# Patient Record
Sex: Male | Born: 1960 | Race: White | Hispanic: No | Smoking: Current every day smoker
Health system: Southern US, Community
[De-identification: ages and names within clinical notes are randomized; demographics above are authoritative.]

## PROBLEM LIST (undated history)

## (undated) DIAGNOSIS — K219 Gastro-esophageal reflux disease without esophagitis: Secondary | ICD-10-CM

## (undated) DIAGNOSIS — E785 Hyperlipidemia, unspecified: Secondary | ICD-10-CM

## (undated) DIAGNOSIS — J449 Chronic obstructive pulmonary disease, unspecified: Secondary | ICD-10-CM

## (undated) DIAGNOSIS — I2510 Atherosclerotic heart disease of native coronary artery without angina pectoris: Secondary | ICD-10-CM

## (undated) DIAGNOSIS — E039 Hypothyroidism, unspecified: Secondary | ICD-10-CM

## (undated) DIAGNOSIS — I639 Cerebral infarction, unspecified: Secondary | ICD-10-CM

## (undated) DIAGNOSIS — M3130 Wegener's granulomatosis without renal involvement: Secondary | ICD-10-CM

## (undated) HISTORY — DX: Hyperlipidemia, unspecified: E78.5

## (undated) HISTORY — DX: Cerebral infarction, unspecified: I63.9

## (undated) HISTORY — PX: OTHER SURGICAL HISTORY: SHX169

## (undated) HISTORY — PX: CORONARY ARTERY BYPASS GRAFT: SHX141

## (undated) HISTORY — DX: Atherosclerotic heart disease of native coronary artery without angina pectoris: I25.10

## (undated) HISTORY — DX: Hypothyroidism, unspecified: E03.9

## (undated) HISTORY — DX: Chronic obstructive pulmonary disease, unspecified: J44.9

## (undated) HISTORY — DX: Wegener's granulomatosis without renal involvement: M31.30

## (undated) HISTORY — DX: Gastro-esophageal reflux disease without esophagitis: K21.9

---

## 2005-04-10 ENCOUNTER — Ambulatory Visit: Payer: Self-pay | Admitting: Endocrinology

## 2005-05-04 ENCOUNTER — Ambulatory Visit: Payer: Self-pay | Admitting: Endocrinology

## 2005-06-08 ENCOUNTER — Ambulatory Visit: Payer: Self-pay | Admitting: Endocrinology

## 2005-07-24 ENCOUNTER — Ambulatory Visit: Payer: Self-pay | Admitting: Endocrinology

## 2006-01-25 ENCOUNTER — Ambulatory Visit: Payer: Self-pay | Admitting: Endocrinology

## 2006-03-21 ENCOUNTER — Ambulatory Visit: Payer: Self-pay | Admitting: Endocrinology

## 2006-05-02 ENCOUNTER — Ambulatory Visit: Payer: Self-pay | Admitting: Endocrinology

## 2006-05-02 LAB — CONVERTED CEMR LAB
TSH: 2.96 microintl units/mL (ref 0.35–5.50)
Testosterone: 689.93 ng/dL (ref 350.00–890)

## 2006-08-01 ENCOUNTER — Ambulatory Visit: Payer: Self-pay | Admitting: Endocrinology

## 2006-08-07 ENCOUNTER — Encounter: Payer: Self-pay | Admitting: Endocrinology

## 2006-08-07 DIAGNOSIS — E039 Hypothyroidism, unspecified: Secondary | ICD-10-CM

## 2006-08-07 DIAGNOSIS — K219 Gastro-esophageal reflux disease without esophagitis: Secondary | ICD-10-CM

## 2006-08-07 DIAGNOSIS — I2510 Atherosclerotic heart disease of native coronary artery without angina pectoris: Secondary | ICD-10-CM | POA: Insufficient documentation

## 2006-08-07 HISTORY — DX: Gastro-esophageal reflux disease without esophagitis: K21.9

## 2006-08-07 HISTORY — DX: Hypothyroidism, unspecified: E03.9

## 2006-08-07 HISTORY — DX: Atherosclerotic heart disease of native coronary artery without angina pectoris: I25.10

## 2006-10-31 ENCOUNTER — Ambulatory Visit: Payer: Self-pay | Admitting: Endocrinology

## 2006-10-31 LAB — CONVERTED CEMR LAB: Hgb A1c MFr Bld: 9.5 % — ABNORMAL HIGH (ref 4.6–6.0)

## 2006-11-12 ENCOUNTER — Encounter: Payer: Self-pay | Admitting: Endocrinology

## 2007-01-13 ENCOUNTER — Telehealth (INDEPENDENT_AMBULATORY_CARE_PROVIDER_SITE_OTHER): Payer: Self-pay | Admitting: *Deleted

## 2007-01-31 ENCOUNTER — Encounter: Payer: Self-pay | Admitting: Internal Medicine

## 2007-02-11 ENCOUNTER — Encounter: Payer: Self-pay | Admitting: Endocrinology

## 2007-02-26 ENCOUNTER — Ambulatory Visit: Payer: Self-pay | Admitting: Endocrinology

## 2007-02-26 DIAGNOSIS — I889 Nonspecific lymphadenitis, unspecified: Secondary | ICD-10-CM

## 2007-02-26 LAB — CONVERTED CEMR LAB: Hgb A1c MFr Bld: 9.2 % — ABNORMAL HIGH (ref 4.6–6.0)

## 2007-05-27 ENCOUNTER — Ambulatory Visit: Payer: Self-pay | Admitting: Endocrinology

## 2007-05-27 DIAGNOSIS — J309 Allergic rhinitis, unspecified: Secondary | ICD-10-CM | POA: Insufficient documentation

## 2007-05-27 DIAGNOSIS — F411 Generalized anxiety disorder: Secondary | ICD-10-CM

## 2007-06-05 ENCOUNTER — Encounter: Payer: Self-pay | Admitting: Endocrinology

## 2007-06-10 ENCOUNTER — Telehealth (INDEPENDENT_AMBULATORY_CARE_PROVIDER_SITE_OTHER): Payer: Self-pay | Admitting: *Deleted

## 2007-06-11 ENCOUNTER — Telehealth: Payer: Self-pay | Admitting: Endocrinology

## 2007-06-26 ENCOUNTER — Telehealth: Payer: Self-pay | Admitting: Endocrinology

## 2007-06-26 ENCOUNTER — Ambulatory Visit: Payer: Self-pay | Admitting: Endocrinology

## 2007-06-29 ENCOUNTER — Encounter: Payer: Self-pay | Admitting: Endocrinology

## 2007-06-30 ENCOUNTER — Encounter: Payer: Self-pay | Admitting: Endocrinology

## 2007-07-07 ENCOUNTER — Telehealth (INDEPENDENT_AMBULATORY_CARE_PROVIDER_SITE_OTHER): Payer: Self-pay | Admitting: *Deleted

## 2007-07-10 ENCOUNTER — Ambulatory Visit: Payer: Self-pay | Admitting: Endocrinology

## 2007-07-14 ENCOUNTER — Encounter: Payer: Self-pay | Admitting: Endocrinology

## 2007-07-22 ENCOUNTER — Encounter: Payer: Self-pay | Admitting: Endocrinology

## 2007-08-13 ENCOUNTER — Ambulatory Visit: Payer: Self-pay | Admitting: Endocrinology

## 2007-09-01 ENCOUNTER — Ambulatory Visit: Payer: Self-pay | Admitting: Endocrinology

## 2007-09-01 DIAGNOSIS — N529 Male erectile dysfunction, unspecified: Secondary | ICD-10-CM

## 2007-09-01 DIAGNOSIS — L538 Other specified erythematous conditions: Secondary | ICD-10-CM

## 2007-09-04 ENCOUNTER — Encounter: Payer: Self-pay | Admitting: Endocrinology

## 2007-09-26 ENCOUNTER — Ambulatory Visit: Payer: Self-pay | Admitting: Endocrinology

## 2007-11-27 ENCOUNTER — Encounter: Payer: Self-pay | Admitting: Endocrinology

## 2007-12-22 ENCOUNTER — Telehealth (INDEPENDENT_AMBULATORY_CARE_PROVIDER_SITE_OTHER): Payer: Self-pay | Admitting: *Deleted

## 2007-12-25 ENCOUNTER — Ambulatory Visit: Payer: Self-pay | Admitting: Endocrinology

## 2007-12-25 LAB — CONVERTED CEMR LAB: Hgb A1c MFr Bld: 9.4 % — ABNORMAL HIGH (ref 4.6–6.0)

## 2008-04-13 ENCOUNTER — Telehealth: Payer: Self-pay | Admitting: Endocrinology

## 2008-04-20 ENCOUNTER — Ambulatory Visit: Payer: Self-pay | Admitting: Endocrinology

## 2008-04-20 LAB — CONVERTED CEMR LAB: Hgb A1c MFr Bld: 8.9 % — ABNORMAL HIGH (ref 4.6–6.5)

## 2008-06-03 ENCOUNTER — Encounter: Payer: Self-pay | Admitting: Endocrinology

## 2008-07-06 ENCOUNTER — Encounter: Payer: Self-pay | Admitting: Endocrinology

## 2008-07-21 ENCOUNTER — Encounter: Payer: Self-pay | Admitting: Endocrinology

## 2008-07-22 ENCOUNTER — Ambulatory Visit: Payer: Self-pay | Admitting: Endocrinology

## 2008-08-20 ENCOUNTER — Telehealth: Payer: Self-pay | Admitting: Endocrinology

## 2010-01-24 ENCOUNTER — Telehealth: Payer: Self-pay | Admitting: Endocrinology

## 2010-01-24 ENCOUNTER — Ambulatory Visit
Admission: RE | Admit: 2010-01-24 | Discharge: 2010-01-24 | Payer: Self-pay | Source: Home / Self Care | Attending: Endocrinology | Admitting: Endocrinology

## 2010-01-24 DIAGNOSIS — L97909 Non-pressure chronic ulcer of unspecified part of unspecified lower leg with unspecified severity: Secondary | ICD-10-CM | POA: Insufficient documentation

## 2010-02-07 ENCOUNTER — Ambulatory Visit: Admit: 2010-02-07 | Payer: Self-pay | Admitting: Endocrinology

## 2010-02-07 ENCOUNTER — Ambulatory Visit
Admission: RE | Admit: 2010-02-07 | Discharge: 2010-02-07 | Payer: Self-pay | Source: Home / Self Care | Attending: Endocrinology | Admitting: Endocrinology

## 2010-02-09 NOTE — Progress Notes (Signed)
Summary: rx ? from Pharm  Phone Note From Pharmacy   Caller: [facility] Summary of Call: [facility] sent fax regarding rx's sent in for pt's insulin this morning. Per pharmacy, the Novolog flexpen needs specific directions because they cannot use as directed for insurance purposes.  They also want to know if you want a separate rx for pen needles and insulin syringes (for Levemir)-please advise Initial call taken by: Brenton Grills CMA Duncan Dull),  January 24, 2010 1:48 PM  Follow-up for Phone Call        sent Follow-up by: Minus Breeding MD,  January 24, 2010 1:59 PM    New/Updated Medications: NOVOLOG FLEXPEN 100 UNIT/ML SOLN (INSULIN ASPART) take three times a day (just before each meal), 2 units for high-200's,and  5 units for any over 300 INSULIN SYRINGE 31G X 5/16" 0.5 ML MISC (INSULIN SYRINGE-NEEDLE U-100) use once daily BD PEN NEEDLE MINI U/F 31G X 5 MM MISC (INSULIN PEN NEEDLE) 50-unit, once daily Prescriptions: NOVOLOG FLEXPEN 100 UNIT/ML SOLN (INSULIN ASPART) take three times a day (just before each meal), 2 units for high-200's,and  5 units for any over 300  #1 box x 11   Entered and Authorized by:   Minus Breeding MD   Signed by:   Minus Breeding MD on 01/24/2010   Method used:   Electronically to        [facility] (retail)       [address]       [place], [place]  [address]       Ph: [phone] or [phone]       Fax: [phone]   RxID:   [id number] BD PEN NEEDLE MINI U/F 31G X 5 MM MISC (INSULIN PEN NEEDLE) 50-unit, once daily  #30 x 11   Entered and Authorized by:   Minus Breeding MD   Signed by:   Minus Breeding MD on 01/24/2010   Method used:   Electronically to        [facility] (retail)       [address]       [place], [place]  [address]       Ph: [phone] or [phone]       Fax: [phone]   RxID:   [id number] INSULIN SYRINGE 31G X 5/16" 0.5 ML  MISC (INSULIN SYRINGE-NEEDLE U-100) use once daily  #30 x 11   Entered and Authorized by:   Minus Breeding MD   Signed by:   Minus Breeding MD on 01/24/2010   Method used:   Electronically to        [facility] (retail)       [address]       [place], [place]  [address]       Ph: [phone] or [phone]       Fax: [phone]   RxID:   [id number]

## 2010-02-09 NOTE — Assessment & Plan Note (Signed)
Summary: PER Kenneth Sheppard--SORE ON LEG/DRAINING--ER/URG CAR IF WORSEN STC   Vital Signs:  Patient profile:   50 year old male Height:      71 inches (180.34 cm) Weight:      149 pounds (67.73 kg) BMI:     20.86 O2 Sat:      97 % on Room air Temp:     97.9 degrees F (36.61 degrees C) oral Pulse rate:   88 / minute Pulse rhythm:   regular BP sitting:   98 / 50  (left arm) Cuff size:   regular  Vitals Entered By: Brenton Grills CMA Duncan Dull) (January 24, 2010 8:03 AM)  O2 Flow:  Room air CC: Wound on L leg/pt is no longer taking Multivitamins, Prilosec, Ambien, Levoxyl, or Levitra/aj Is Patient Diabetic? Yes   Primary Provider:  b hodges  CC:  Wound on L leg/pt is no longer taking Multivitamins, Prilosec, Ambien, Levoxyl, and or Levitra/aj.  History of Present Illness: 3 weeks ago, pt was walking outside, and "ran into something."  he had moderate pain at the left leg since then.  he was seen in er at [facility], and was rx'ed keflex and septra.  he has assoc numbness of the legs.   no cbg record, but states cbg's continue to be extremely variable.    Current Medications (verified): 1)  Multivitamins   Tabs (Multiple Vitamin) .... Take 1 By Mouth Qd 2)  Prilosec 20 Mg  Cpdr (Omeprazole) .... Take 1 By Mouth Bid 3)  Zocor 80 Mg  Tabs (Simvastatin) .... Take 1 By Mouth Qd 4)  Ambien 10 Mg  Tabs (Zolpidem Tartrate) .... 1/2 Qhs 5)  Glucometer Elite Test   Strp (Glucose Blood) .... Qid--Lancets Also 250.03 Variable Glucoses 6)  Levoxyl 150 Mcg Tabs (Levothyroxine Sodium) .... Take 1 By Mouth Qd 7)  Insulin Pen Needle, and Brand and Size .... Qid 8)  Novolog Mix 70/30 70-30 % Susp (Insulin Aspart Prot & Aspart) .... 28 Units Qam  20 Qpm 9)  Levitra 20 Mg Tabs (Vardenafil Hcl) .... As Needed Use 10)  Lisinopril-Hydrochlorothiazide 10-12.5 Mg Tabs (Lisinopril-Hydrochlorothiazide) .Marland Kitchen.. 1 Tab Daily 11)  Neurontin 400 Mg Caps (Gabapentin) .Marland Kitchen.. 1 Tab Three Times A Day 12)  Plavix 75 Mg  Tabs (Clopidogrel Bisulfate) .Marland Kitchen.. 1 Tablet By Mouth Once Daily 13)  Aspirin 81 Mg Tbec (Aspirin) .Marland Kitchen.. 1 By Mouth Once Daily 14)  Sulfamethoxazole-Trimethoprim 200-40 Mg/1ml Susp (Sulfamethoxazole-Trimethoprim) .Marland Kitchen.. 1 Tablet By Mouth Two Times A Day 15)  Cephalexin 500 Mg Caps (Cephalexin) .Marland Kitchen.. 1 Capsule By Mouth Three Times A Day  Allergies (verified): 1)  ! Codeine  Past History:  Past Medical History: Last updated: 02/26/2007 Coronary artery disease Diabetes mellitus, type II GERD Hypothyroidism Dyslipidemia Smoker DM Neparopathy necrotizing granuloma  Review of Systems  The patient denies fever and syncope.         he has frequent mild hypoglycemia  Physical Exam  General:  normal appearance.   Extremities:  at the left leg, there is a deep ulcer, approx 9x3 cm, longitudonal.  there is purulent drainage.  it overlies his vein harvest site.     Impression & Recommendations:  Problem # 1:  DIABETIC ULCER, LEFT LEG (ICD-707.10) Assessment New  Problem # 2:  DIABETES MELLITUS, TYPE I, ADULT ONSET (ICD-250.01) he needs a simpler insulin schedule  Medications Added to Medication List This Visit: 1)  Plavix 75 Mg Tabs (Clopidogrel bisulfate) .Marland Kitchen.. 1 tablet by mouth once daily 2)  Aspirin 81 Mg Tbec (Aspirin) .Marland Kitchen.. 1 by mouth once daily 3)  Sulfamethoxazole-trimethoprim 200-40 Mg/110ml Susp (Sulfamethoxazole-trimethoprim) .Marland Kitchen.. 1 tablet by mouth two times a day 4)  Cephalexin 500 Mg Caps (Cephalexin) .Marland Kitchen.. 1 capsule by mouth three times a day 5)  Levemir 100 Unit/ml Soln (Insulin detemir) .... 45 units each am, and syringes two times a day 6)  Novolog Flexpen 100 Unit/ml Soln (Insulin aspart) .... As dir, and pen needles once daily  Other Orders: Wound Care Center Referral (Wound Care) Est. Patient Level IV (29562)  Patient Instructions: 1)  refer to a wound-care specialist.  you will be called with a day and time for an appointment. 2)  for now, continue same antibiotics,  and keep it covered with antibiotic ointment, and a large bandaid. 3)  change insulin to levemir 45 units each am. 4)  also take novolog 2 units whenever blood sugar is 250-299.  take 5 units whenever it is over 300. 5)  Please schedule a follow-up appointment in 2 weeks. 6)  check your blood sugar 4 times a day--before the 3 meals, and at bedtime.  also check if you have symptoms of your blood sugar being too high or too low.  please keep a record of the readings and bring it to your next appointment here.  please call us sooner if you are having low blood sugar episodes. 7)  good diet and exercise habits significanly improve the control of your diabetes.  please let me know if you wish to be referred to a dietician.  high blood sugar is very risky to your health.  you should see an eye doctor every year. 8)  controlling your blood pressure and cholesterol drastically reduces the damage diabetes does to your body.  this also applies to quitting smoking.  please discuss these with your doctor.  you should take an aspirin every day, unless you have been advised by a doctor not to. Prescriptions: NOVOLOG FLEXPEN 100 UNIT/ML SOLN (INSULIN ASPART) as dir, and pen needles once daily  #1 box x 2   Entered and Authorized by:   Minus Breeding MD   Signed by:   Minus Breeding MD on 01/24/2010   Method used:   Electronically to        [facility] (retail)       [address]       [place], [place]  [address]       Ph: [phone] or [phone]       Fax: [phone]   RxID:   [id number] LEVEMIR 100 UNIT/ML SOLN (INSULIN DETEMIR) 45 units each am, and syringes two times a day  #2 vials x 2   Entered and Authorized by:   Minus Breeding MD   Signed by:   Minus Breeding MD on 01/24/2010   Method used:   Electronically to        [facility] (retail)       [address]       [place], [place]  [address]        Ph: [phone] or [phone]       Fax: [phone]   RxID:   [id number]    Orders Added: 1)  Wound Care Center Referral [Wound Care] 2)  Est. Patient Level IV [[address]]

## 2010-02-15 NOTE — Assessment & Plan Note (Signed)
Summary: f/u appt/#/cd   Vital Signs:  Patient profile:   50 year old male Height:      71 inches (180.34 cm) Weight:      153.38 pounds (69.72 kg) BMI:     21.47 O2 Sat:      96 % on Room air Temp:     98.3 degrees F (36.83 degrees C) oral Pulse rate:   81 / minute Pulse rhythm:   regular BP sitting:   104 / 62  (left arm) Cuff size:   regular  Vitals Entered By: Brenton Grills CMA Duncan Dull) (February 07, 2010 1:09 PM)  O2 Flow:  Room air CC: Follow up on DM/discuss Levemir/aj Is Patient Diabetic? Yes Comments pt is no longer taking Zocor or Ambien   Primary Provider:  b hodges  CC:  Follow up on DM/discuss Levemir/aj.  History of Present Illness: pt is having hypoglycemia in the afternoon.  on 1 occasion, he was taken to er in the afternoon.  he says he was taking novolog before lunch, even if cbg was not high enough to meet the criteria listed at last ov.  no cbg record.  Current Medications (verified): 1)  Zocor 80 Mg  Tabs (Simvastatin) .... Take 1 By Mouth Qd 2)  Ambien 10 Mg  Tabs (Zolpidem Tartrate) .... 1/2 Qhs 3)  Glucometer Elite Test   Strp (Glucose Blood) .... Qid--Lancets Also 250.03 Variable Glucoses 4)  Levoxyl 150 Mcg Tabs (Levothyroxine Sodium) .... Take 1 By Mouth Qd 5)  Insulin Pen Needle, and Brand and Size .... Qid 6)  Lisinopril-Hydrochlorothiazide 10-12.5 Mg Tabs (Lisinopril-Hydrochlorothiazide) .Marland Kitchen.. 1 Tab Daily 7)  Neurontin 400 Mg Caps (Gabapentin) .Marland Kitchen.. 1 Tab Three Times A Day 8)  Plavix 75 Mg Tabs (Clopidogrel Bisulfate) .Marland Kitchen.. 1 Tablet By Mouth Once Daily 9)  Aspirin 81 Mg Tbec (Aspirin) .Marland Kitchen.. 1 By Mouth Once Daily 10)  Levemir 100 Unit/ml Soln (Insulin Detemir) .... 45 Units Each Am, and Syringes Two Times A Day 11)  Novolog Flexpen 100 Unit/ml Soln (Insulin Aspart) .... Take Three Times A Day (Just Before Each Meal), 2 Units For High-200's,and  5 Units For Any Over 300 12)  Insulin Syringe 31g X 5/16" 0.5 Ml Misc (Insulin Syringe-Needle U-100) ....  Use Once Daily 13)  Bd Pen Needle Mini U/f 31g X 5 Mm Misc (Insulin Pen Needle) .... 50-Unit, Once Daily  Allergies (verified): 1)  ! Codeine  Past History:  Past Medical History: Last updated: 02/26/2007 Coronary artery disease Diabetes mellitus, type II GERD Hypothyroidism Dyslipidemia Smoker DM Neparopathy necrotizing granuloma  Review of Systems  The patient denies weight loss and weight gain.    Physical Exam  General:  normal appearance.   Skin:  injection sites at anterior abdomen are without lesions, except for a few ecchymoses.   Impression & Recommendations:  Problem # 1:  DIABETES MELLITUS, TYPE I, ADULT ONSET (ICD-250.01) apparently overcontrolled  Medications Added to Medication List This Visit: 1)  Levemir 100 Unit/ml Soln (Insulin detemir) .... 40 units each am, and syringes two times a day  Other Orders: Est. Patient Level III (81191)  Patient Instructions: 1)  reduce levemir to 40 units each am. 2)  also take novolog 2 units whenever blood sugar is 300-399.  take 4 units whenever it is over 400.  3)  Please schedule a follow-up appointment in 2 weeks. 4)  check your blood sugar 4 times a day--before the 3 meals, and at bedtime.  also check if you have  symptoms of your blood sugar being too high or too low.  please keep a record of the readings and bring it to your next appointment here.  please call us sooner if you are having low blood sugar episodes.   Orders Added: 1)  Est. Patient Level III [38756]

## 2010-02-21 ENCOUNTER — Ambulatory Visit (INDEPENDENT_AMBULATORY_CARE_PROVIDER_SITE_OTHER): Payer: Medicare Other | Admitting: Endocrinology

## 2010-02-21 ENCOUNTER — Encounter: Payer: Self-pay | Admitting: Endocrinology

## 2010-02-21 DIAGNOSIS — E109 Type 1 diabetes mellitus without complications: Secondary | ICD-10-CM

## 2010-03-01 NOTE — Assessment & Plan Note (Signed)
Summary: 2 wk rov /nws  #   Vital Signs:  Patient profile:   50 year old male Height:      71 inches (180.34 cm) Weight:      148.25 pounds (67.39 kg) BMI:     20.75 O2 Sat:      97 % on Room air Temp:     98.2 degrees F (36.78 degrees C) oral Pulse rate:   81 / minute Pulse rhythm:   regular BP sitting:   92 / 60  (left arm) Cuff size:   regular  Vitals Entered By: Brenton Grills CMA (AAMA) (February 21, 2010 1:15 PM)  O2 Flow:  Room air CC: 2 week F/U/pt is no longer taking Ambien/aj Is Patient Diabetic? Yes   Primary Provider:  b hodges  CC:  2 week F/U/pt is no longer taking Ambien/aj.  History of Present Illness: pt states he feels well in general.  he brings a record of his cbg's which i have reviewed today.  it varies from 45 (afternoon and hs) to 300 (am).  no dizziness.  Current Medications (verified): 1)  Zocor 80 Mg  Tabs (Simvastatin) .... Take 1 By Mouth Qd 2)  Ambien 10 Mg  Tabs (Zolpidem Tartrate) .... 1/2 Qhs 3)  Glucometer Elite Test   Strp (Glucose Blood) .... Qid--Lancets Also 250.03 Variable Glucoses 4)  Levoxyl 150 Mcg Tabs (Levothyroxine Sodium) .... Take 1 By Mouth Qd 5)  Insulin Pen Needle, and Brand and Size .... Qid 6)  Lisinopril-Hydrochlorothiazide 10-12.5 Mg Tabs (Lisinopril-Hydrochlorothiazide) .Marland Kitchen.. 1 Tab Daily 7)  Neurontin 400 Mg Caps (Gabapentin) .Marland Kitchen.. 1 Tab Three Times A Day 8)  Plavix 75 Mg Tabs (Clopidogrel Bisulfate) .Marland Kitchen.. 1 Tablet By Mouth Once Daily 9)  Aspirin 81 Mg Tbec (Aspirin) .Marland Kitchen.. 1 By Mouth Once Daily 10)  Levemir 100 Unit/ml Soln (Insulin Detemir) .... 40 Units Each Am, and Syringes Two Times A Day 11)  Novolog Flexpen 100 Unit/ml Soln (Insulin Aspart) .... Take Three Times A Day (Just Before Each Meal), 2 Units For High-200's,and  5 Units For Any Over 300 12)  Insulin Syringe 31g X 5/16" 0.5 Ml Misc (Insulin Syringe-Needle U-100) .... Use Once Daily 13)  Bd Pen Needle Mini U/f 31g X 5 Mm Misc (Insulin Pen Needle) .... 50-Unit,  Once Daily  Allergies (verified): 1)  ! Codeine  Past History:  Past Medical History: Last updated: 02/26/2007 Coronary artery disease Diabetes mellitus, type II GERD Hypothyroidism Dyslipidemia Smoker DM Neparopathy necrotizing granuloma  Family History: Reviewed history and no changes required. father deceased at age 76 due to dm  Social History: Reviewed history and no changes required. divorced several times disabled 2 children  Review of Systems  The patient denies syncope.    Physical Exam  General:  normal appearance.    Extremities:  (sees wound care) Psych:  Alert and cooperative; normal mood and affect; normal attention span and concentration.     Impression & Recommendations:  Problem # 1:  DIABETES MELLITUS, TYPE I, ADULT ONSET (ICD-250.01) the pattern of his cbg's indicates he needs less levemir in the am, and some in the evening. therapy limited by pt's need for a simple regimen  Problem # 2:  hypertension overcontrolled  Medications Added to Medication List This Visit: 1)  Novolog Flexpen 100 Unit/ml Soln (Insulin aspart) .... Take three times a day (just before each meal), 2 units for high-200's,and  5 units for any over 300 2)  Levemir Flexpen 100 Unit/ml Soln (Insulin  detemir) .... 35 units each mornign, and 5 units each evening  Other Orders: Est. Patient Level III ([address])  Patient Instructions: 1)  change levemir to 35 units each morning, and 5 units each evening. 2)  also take novolog 2 units whenever blood sugar is 300-399.  take 4 units whenever it is over 400.  3)  Please schedule a follow-up appointment in 2 months. 4)  check your blood sugar 4 times a day--before the 3 meals, and at bedtime.  also check if you have symptoms of your blood sugar being too high or too low.  please keep a record of the readings and bring it to your next appointment here.  please call us sooner if you are having low blood sugar episodes. 5)  please see  dr Yetta Flock soon, as your blood pressure is slightly low.   Prescriptions: LEVEMIR FLEXPEN 100 UNIT/ML SOLN (INSULIN DETEMIR) 35 units each mornign, and 5 units each evening  #1 box x 11   Entered and Authorized by:   Minus Breeding MD   Signed by:   Minus Breeding MD on 02/21/2010   Method used:   Electronically to        [facility] (retail)       [address]       [place], [place]  [address]       Ph: [phone] or [phone]       Fax: [phone]   RxID:   [id number]    Orders Added: 1)  Est. Patient Level III [47425]

## 2010-03-20 ENCOUNTER — Telehealth: Payer: Self-pay | Admitting: Endocrinology

## 2010-03-27 ENCOUNTER — Telehealth: Payer: Self-pay | Admitting: Endocrinology

## 2010-03-28 NOTE — Progress Notes (Signed)
Summary: rx refill req  Phone Note Refill Request Message from:  Fax from Pharmacy on March 20, 2010 10:03 AM  Refills Requested: Medication #1:  LEVEMIR FLEXPEN 100 UNIT/ML SOLN 35 units each mornign  Medication #2:  NOVOLOG FLEXPEN 100 UNIT/ML SOLN take three times a day (just before each meal)  Medication #3:  BD PEN NEEDLE MINI U/F 31G X 5 MM MISC 50-unit [facility]    Method Requested: Fax to [facility] Next Appointment Scheduled: 04/25/2010 Initial call taken by: Brenton Grills CMA (AAMA),  March 20, 2010 10:04 AM    Prescriptions: NOVOLOG FLEXPEN 100 UNIT/ML SOLN (INSULIN ASPART) take three times a day (just before each meal), 2 units for high-200's,and  5 units for any over 300  #3 month x 2   Entered by:   Brenton Grills CMA (AAMA)   Authorized by:   Minus Breeding MD   Signed by:   Brenton Grills CMA (AAMA) on 03/20/2010   Method used:   Faxed to ...       [facility])       [address]       [place], [place]  [address]       Ph: [phone]       Fax: [phone]   RxID:   [id number] LEVEMIR FLEXPEN 100 UNIT/ML SOLN (INSULIN DETEMIR) 35 units each mornign, and 5 units each evening  #3 month x 2   Entered by:   Brenton Grills CMA (AAMA)   Authorized by:   Minus Breeding MD   Signed by:   Brenton Grills CMA (AAMA) on 03/20/2010   Method used:   Faxed to ...       [facility] Environmental education officer)       [address]       [place], [place]  [address]       Ph: [phone]       Fax: [phone]   RxID:   [id number] BD PEN NEEDLE MINI U/F 31G X 5 MM MISC (INSULIN PEN NEEDLE) 50-unit, once daily  #90 x 2   Entered by:   Brenton Grills CMA (AAMA)   Authorized by:   Minus Breeding MD   Signed by:   Brenton Grills CMA (AAMA) on 03/20/2010   Method used:   Faxed to ...       [facility] Environmental education officer)       [address]       [place], [place]  [address]       Ph: [phone]       Fax: [phone]   RxID:    [id number]

## 2010-04-06 NOTE — Progress Notes (Signed)
Summary: Rx correction  Phone Note Refill Request Message from:  Pharmacy on March 27, 2010 3:34 PM  Refills Requested: Medication #1:  BD PEN NEEDLE MINI U/F 31G X 5 MM MISC 50-unit   Dosage confirmed as above?Dosage Confirmed   Supply Requested: 9 months [facility] called stating directions and quanity are incorrect   Method Requested: [facility]ax to [facility] Initial call taken by: Margaret Pyle, CMA,  March 27, 2010 3:34 PM  Follow-up for Phone Call        pen needles are qid Follow-up by: Minus Breeding MD,  March 27, 2010 4:03 PM    New/Updated Medications: BD PEN NEEDLE MINI U/F 31G X 5 MM MISC (INSULIN PEN NEEDLE) use as directed 5 times daily Prescriptions: BD PEN NEEDLE MINI U/F 31G X 5 MM MISC (INSULIN PEN NEEDLE) use as directed 5 times daily  #5 boxes x 2   Entered by:   Margaret Pyle, CMA   Authorized by:   Minus Breeding MD   Signed by:   Margaret Pyle, CMA on 03/27/2010   Method used:   Faxed to ...       [facility] Environmental education officer)       [address]       [place], [place]  [address]       Ph: [phone]       Fax: [phone]   RxID:   [id number]

## 2010-04-25 ENCOUNTER — Ambulatory Visit: Payer: Medicare Other | Admitting: Endocrinology

## 2010-04-28 ENCOUNTER — Other Ambulatory Visit (INDEPENDENT_AMBULATORY_CARE_PROVIDER_SITE_OTHER): Payer: Medicare Other

## 2010-04-28 ENCOUNTER — Ambulatory Visit (INDEPENDENT_AMBULATORY_CARE_PROVIDER_SITE_OTHER): Payer: Medicare Other | Admitting: Endocrinology

## 2010-04-28 VITALS — BP 110/66 | HR 75 | Temp 98.3°F | Ht 71.0 in | Wt 150.6 lb

## 2010-04-28 DIAGNOSIS — E109 Type 1 diabetes mellitus without complications: Secondary | ICD-10-CM

## 2010-04-28 LAB — GLUCOSE, POCT (MANUAL RESULT ENTRY)

## 2010-04-28 LAB — HEMOGLOBIN A1C: Hgb A1c MFr Bld: 9.9 % — ABNORMAL HIGH (ref 4.6–6.5)

## 2010-04-28 MED ORDER — SILDENAFIL CITRATE 100 MG PO TABS: 100.0000 mg | ORAL_TABLET | ORAL | Status: DC | PRN

## 2010-04-28 MED ORDER — TRIAMCINOLONE ACETONIDE 0.025 % EX OINT: TOPICAL_OINTMENT | Freq: Two times a day (BID) | CUTANEOUS | Status: AC

## 2010-04-28 NOTE — Progress Notes (Signed)
  Subjective:    Patient ID: Kenneth Sheppard, male    DOB: [birth year], 50 y.o.   MRN: [id number]  HPI The state of at least three ongoing medical problems is addressed today: Dm:  Pt says cbg's have been better overall, but he has frequent hypoglycemia sxs when he is active during the day.  This happens at least 3/week. It is in the mid-100's in am.   The left leg wound has healed.   He also has ongoing patches on the legs.  The worst one is on the right ankle.  He says the dermatologist told him it was a granulomatous condition.   Past Medical History  Diagnosis Date  . CORONARY ARTERY DISEASE 08/07/2006  . GERD 08/07/2006  . HYPOTHYROIDISM 08/07/2006  . Diabetes mellitus     Type 2  . Dyslipidemia   . Necrotizing respiratory granulomatosis     Necrotizing granuloma   Past Surgical History  Procedure Date  . Coronary artery bypass graft     does not have a smoking history on file. He does not have any smokeless tobacco history on file. His alcohol and drug histories not on file. family history includes Diabetes in his father. Allergies  Allergen Reactions  . Codeine     Review of Systems Denies loc and weight change.     Objective:   Physical Exam GENERAL: no distress Ext:  The left leg ulcer is healing, but still is 8 cm of eczematous erythema. Skin:  There is a 5 cm area on the right med malleolus, again of eczematous change.       Assessment & Plan:  Dm.  therapy limited by pt's need for a simple regimen, and by his variable schedule Leg ulcer, improved. Rash, uncertain etiology

## 2010-04-28 NOTE — Patient Instructions (Addendum)
continue levemir 35 units each morning, and 5 units each evening. However, if you are going to be active, take only 25 units.  If you cannot anticipate the activity, eat a light snack to avoid low blood sugar.  It is best to carry non-perishable snacks, so you'll always have it when you need it.   also take novolog 2 units whenever blood sugar is 300-399.  take 4 units whenever it is over 400.   i have sent a prescription for a skin ointment for your right ankle.   You should call [phone], to a pharmacy in [place], which sells viagra at $65 for 20 pills.  Here is a prescription.

## 2010-05-26 NOTE — Assessment & Plan Note (Signed)
[facility]                                 ON-CALL NOTE   Kenneth Sheppard, Kenneth Sheppard                        MRN:          [id number]  DATE:07/11/2007                            DOB:          July 25, 1960    Time received is 12:10 p.m.  The caller is his wife.  He sees Dr.  Everardo All.  Telephone [phone].  The patient has diabetes, which is  usually quite well controlled.  He had been taking NovoLog 75/25, to  take 14 units in the morning and 8 units in the evening for the past few  months at least.  His sugars have been quite stable.  Due to cost  reasons, several days ago, this was switched to Novolin 70/30, again to  take 14 units in the a.m. and 8 units in the p.m.  His fasting blood  sugar this morning was 430.  The patient feels fine, but the caller is  asking what we should do.  Apparently, there has been no recent change  in diet or other medications.  This is his only diabetic medication.  My  advice was to take 10 additional units of Novolin now and to drink  plenty of fluids today.  This evening, he should return to his usual  dosing regimen.  He should follow up with Dr. Everardo All next week, but I  encouraged him to contact me again over the weekend if he ever got blood  glucoses above 300 again.     Tera Mater. Clent Ridges, MD  Electronically Signed    SAF/MedQ  DD: 07/11/2007  DT: 07/11/2007  Job #: [id number]

## 2010-05-31 ENCOUNTER — Ambulatory Visit (INDEPENDENT_AMBULATORY_CARE_PROVIDER_SITE_OTHER): Payer: Medicare Other | Admitting: Endocrinology

## 2010-05-31 ENCOUNTER — Encounter: Payer: Self-pay | Admitting: Endocrinology

## 2010-05-31 VITALS — BP 122/72 | HR 95 | Temp 98.1°F | Ht 71.0 in | Wt 147.4 lb

## 2010-05-31 DIAGNOSIS — E109 Type 1 diabetes mellitus without complications: Secondary | ICD-10-CM

## 2010-05-31 NOTE — Progress Notes (Signed)
Subjective:    Patient ID: IHAN PAT, male    DOB: May 26, 1960, 50 y.o.   MRN: [id number]  HPI Pt states few years of moderate pain of the feet, and assoc numbness.  He says he has pain despite the neurontin.  He says dr Yetta Flock rx'es him meds for the pain, but he wants me to rx vicodin. The ulcers at the right ankle, and leg, are improved with the cream i rx'ed, but not resolved.   no cbg record, but states cbg's are well-controlled, except for after a steroid injection to his right shoulder. Past Medical History  Diagnosis Date  . CORONARY ARTERY DISEASE 08/07/2006  . GERD 08/07/2006  . HYPOTHYROIDISM 08/07/2006  . Diabetes mellitus     Type 2  . Dyslipidemia   . Necrotizing respiratory granulomatosis     Necrotizing granuloma    Past Surgical History  Procedure Date  . Coronary artery bypass graft     History   Social History  . Marital Status: Married    Spouse Name: N/A    Number of Children: 2  . Years of Education: N/A   Occupational History  .      Disabled   Social History Main Topics  . Smoking status: Current Everyday Smoker -- 1.0 packs/day    Types: Cigarettes  . Smokeless tobacco: Not on file  . Alcohol Use: Not on file  . Drug Use: Not on file  . Sexually Active: Not on file   Other Topics Concern  . Not on file   Social History Narrative   Divorced several times    Current Outpatient Prescriptions on File Prior to Visit  Medication Sig Dispense Refill  . aspirin 81 MG tablet Take 81 mg by mouth daily.        . clopidogrel (PLAVIX) 75 MG tablet Take 75 mg by mouth daily.        Marland Kitchen gabapentin (NEURONTIN) 400 MG capsule Take 400 mg by mouth 3 (three) times daily.        Marland Kitchen glucose blood (GLUCOMETER ELITE TEST STRIPS) test strip Four times a day, variable glucoses. Dx 250.03       . insulin aspart (NOVOLOG FLEXPEN) 100 UNIT/ML injection 2 units for high 200's and 5 units for any over 300      . insulin detemir (LEVEMIR FLEXPEN) 100 UNIT/ML injection  35 units each morning,and 5 units each evening       . Insulin Pen Needle (B-D UF III MINI PEN NEEDLES) 31G X 5 MM MISC Use as directed 5 times daily       . Insulin Syringe-Needle U-100 (INSULIN SYRINGE .5CC/31GX5/16") 31G X 5/16" 0.5 ML MISC Use once daily       . levothyroxine (SYNTHROID, LEVOTHROID) 150 MCG tablet Take 150 mcg by mouth daily.        Marland Kitchen lisinopril-hydrochlorothiazide (PRINZIDE,ZESTORETIC) 10-12.5 MG per tablet Take 1 tablet by mouth daily.        . sildenafil (VIAGRA) 100 MG tablet Take 1 tablet (100 mg total) by mouth as needed for erectile dysfunction.  20 tablet  1  . simvastatin (ZOCOR) 80 MG tablet Take 80 mg by mouth at bedtime.        . triamcinolone (KENALOG) 0.025 % ointment Apply topically 2 (two) times daily.  30 g  0  . DISCONTD: zolpidem (AMBIEN) 10 MG tablet 1/2 tablet at bedtime         Allergies  Allergen Reactions  . Codeine  Family History  Problem Relation Age of Onset  . Diabetes Father     BP 122/72  Pulse 95  Temp(Src) 98.1 F (36.7 C) (Oral)  Ht 5\' 11"  (1.803 m)  Wt 147 lb 6.4 oz (66.86 kg)  BMI 20.56 kg/m2  SpO2 97%    Review of Systems Denies fever.  He has hypoglycemia only with exertion (even if he takes just 25 units of levemir that am).    Objective:   Physical Exam GENERAL: no distress. Pulses: dorsalis pedis intact bilat.   Feet: no deformity.  no ulcer on the feet.  feet are of normal color and temp.  no edema.  There are 9x3 cm healed ulcers at the left med tibial are, and at the right medical malleolus.    There is bilat onychomycosis.   Neuro: sensation is intact to touch on the feet, but severely decreased from normal.       Assessment & Plan:  Dm.  Therapy limited by steroid injection Leg and ankle ulcers.  Pt says he was seen by derm for these Neuropathic pain, for which he sees dr Yetta Flock.  i told pt he should not get rx of this from 2 different drs.

## 2010-05-31 NOTE — Patient Instructions (Addendum)
continue levemir 35 units each morning, and 5 units each evening. However, if you are going to be active, take only 20 units.  If you cannot anticipate the activity, eat a light snack to avoid low blood sugar.  It is best to carry non-perishable snacks, so you'll always have it when you need it.   also take novolog 2 units whenever blood sugar is 300-399.  take 4 units whenever it is over 400.   You should continue the skin ointment for your right ankle ulcer.   You should call [phone], to a pharmacy in [place], which sells viagra at $65 for 20 pills.  Here is a prescription. i have filled out the form for diabetic shoes.  Please sign releases of information from [facility] cardiology and [facility] dermatology.  Please make a follow-up appointment in 3 months Continue to see dr Yetta Flock for your pain. blood tests are being ordered for you today.  please call [phone] to hear your test results.  You will be prompted to enter the 9-digit "MRN" number that appears at the top left of this page, followed by #.  Then you will hear the message.

## 2010-08-01 ENCOUNTER — Ambulatory Visit: Payer: Medicare Other | Admitting: Endocrinology

## 2010-08-30 ENCOUNTER — Ambulatory Visit: Payer: Medicare Other | Admitting: Endocrinology

## 2010-09-07 ENCOUNTER — Encounter: Payer: Self-pay | Admitting: Endocrinology

## 2010-09-07 ENCOUNTER — Other Ambulatory Visit (INDEPENDENT_AMBULATORY_CARE_PROVIDER_SITE_OTHER): Payer: Medicare Other

## 2010-09-07 ENCOUNTER — Ambulatory Visit (INDEPENDENT_AMBULATORY_CARE_PROVIDER_SITE_OTHER): Payer: Medicare Other | Admitting: Endocrinology

## 2010-09-07 VITALS — BP 102/62 | HR 84 | Temp 98.5°F | Ht 71.0 in | Wt 150.0 lb

## 2010-09-07 DIAGNOSIS — E109 Type 1 diabetes mellitus without complications: Secondary | ICD-10-CM

## 2010-09-07 DIAGNOSIS — J069 Acute upper respiratory infection, unspecified: Secondary | ICD-10-CM

## 2010-09-07 MED ORDER — INSULIN ASPART 100 UNIT/ML ~~LOC~~ SOLN: SUBCUTANEOUS | Status: DC

## 2010-09-07 MED ORDER — DOXYCYCLINE HYCLATE 100 MG PO TABS: 100.0000 mg | ORAL_TABLET | Freq: Two times a day (BID) | ORAL | Status: AC

## 2010-09-07 NOTE — Patient Instructions (Addendum)
continue levemir 35 units each morning, and 5 units each evening. However, if you are going to be active, take only 20 units.  If you cannot anticipate the activity, eat a light snack to avoid low blood sugar.  It is best to carry non-perishable snacks, so you'll always have it when you need it.   also take novolog 2 units whenever it is over 400.   Please make a follow-up appointment in 3 months. blood tests are being ordered for you today.  please call [phone] to hear your test results.  You will be prompted to enter the 9-digit "MRN" number that appears at the top left of this page, followed by #.  Then you will hear the message.   i have sent a prescription to your pharmacy, for an antibiotic.  You should also take non-prescription claritin-d with this.  I hope you feel better soon.  If you don't feel better by next week, please call doctor hodges.

## 2010-09-07 NOTE — Progress Notes (Signed)
Subjective:    Patient ID: Kenneth Sheppard, male    DOB: Feb 22, 1960, 50 y.o.   MRN: [id number]  HPI pt states he feels well in general, except for allergy problems.  He is having hypoglycemia in the afternoon:  When he takes 20 units of levemir in the am, then cbg is 300 before lunch.  He takes 203 units of novolog.  Then cbg is low in the afternoon (on 1 occasion, he had severe hypoglycemia).  no cbg record, but states cbg's are well-controlled on days when he is not active and takes levemir 35 unit in the morning.   Pt reports few weeks of congestion in the nose, but no assoc fever. no help with claritin. Past Medical History  Diagnosis Date  . CORONARY ARTERY DISEASE 08/07/2006  . GERD 08/07/2006  . HYPOTHYROIDISM 08/07/2006  . Diabetes mellitus     Type 2  . Dyslipidemia   . Necrotizing respiratory granulomatosis     Necrotizing granuloma    Past Surgical History  Procedure Date  . Coronary artery bypass graft     History   Social History  . Marital Status: Married    Spouse Name: N/A    Number of Children: 2  . Years of Education: N/A   Occupational History  .      Disabled   Social History Main Topics  . Smoking status: Current Everyday Smoker -- 1.0 packs/day    Types: Cigarettes  . Smokeless tobacco: Not on file  . Alcohol Use: Not on file  . Drug Use: Not on file  . Sexually Active: Not on file   Other Topics Concern  . Not on file   Social History Narrative   Divorced several times    Current Outpatient Prescriptions on File Prior to Visit  Medication Sig Dispense Refill  . aspirin 81 MG tablet Take 81 mg by mouth daily.        . cetirizine (ZYRTEC) 10 MG tablet Take 10 mg by mouth daily.        . clopidogrel (PLAVIX) 75 MG tablet Take 75 mg by mouth daily.        Marland Kitchen gabapentin (NEURONTIN) 400 MG capsule Take 400 mg by mouth 3 (three) times daily.        Marland Kitchen glucose blood (GLUCOMETER ELITE TEST STRIPS) test strip Four times a day, variable glucoses. Dx  250.03       . insulin detemir (LEVEMIR FLEXPEN) 100 UNIT/ML injection 35 units each morning,and 5 units each evening       . Insulin Pen Needle (B-D UF III MINI PEN NEEDLES) 31G X 5 MM MISC Use as directed 5 times daily       . Insulin Syringe-Needle U-100 (INSULIN SYRINGE .5CC/31GX5/16") 31G X 5/16" 0.5 ML MISC Use once daily       . levothyroxine (SYNTHROID, LEVOTHROID) 150 MCG tablet Take 150 mcg by mouth daily.        Marland Kitchen lisinopril-hydrochlorothiazide (PRINZIDE,ZESTORETIC) 10-12.5 MG per tablet Take 1 tablet by mouth daily.        . sildenafil (VIAGRA) 100 MG tablet Take 1 tablet (100 mg total) by mouth as needed for erectile dysfunction.  20 tablet  1  . simvastatin (ZOCOR) 80 MG tablet Take 80 mg by mouth at bedtime.        . triamcinolone (KENALOG) 0.025 % ointment Apply topically 2 (two) times daily.  30 g  0    Allergies  Allergen Reactions  . Codeine  Family History  Problem Relation Age of Onset  . Diabetes Father     BP 102/62  Pulse 84  Temp(Src) 98.5 F (36.9 C) (Oral)  Ht 5\' 11"  (1.803 m)  Wt 150 lb (68.04 kg)  BMI 20.92 kg/m2  SpO2 95%  Review of Systems Denies weight change and earache     Objective:   Physical Exam head: no deformity eyes: no periorbital swelling, no proptosis external nose and ears are normal mouth: no lesion seen Both eac's and tm's are normal Pulses: dorsalis pedis intact bilat.   Feet: no deformity.  no ulcer on the feet.  feet are of normal color and temp.  no edema.  There eare large healed ulcers at the right posteroir ankle, and at the left ant tibial area.   Neuro: sensation is intact to touch on the feet.  Lab Results  Component Value Date   HGBA1C 10.0* 09/07/2010      Assessment & Plan:  Dm, therapy limited by pt's need for a simple regimen [place], [place]

## 2010-09-19 ENCOUNTER — Other Ambulatory Visit: Payer: Self-pay | Admitting: Endocrinology

## 2010-10-12 ENCOUNTER — Other Ambulatory Visit: Payer: Self-pay

## 2010-10-12 MED ORDER — INSULIN ASPART 100 UNIT/ML ~~LOC~~ SOLN: SUBCUTANEOUS | Status: DC

## 2010-10-12 MED ORDER — INSULIN PEN NEEDLE 31G X 5 MM MISC: 1.0000 | Freq: Every day | Status: DC

## 2010-10-12 MED ORDER — INSULIN DETEMIR 100 UNIT/ML ~~LOC~~ SOLN: SUBCUTANEOUS | Status: DC

## 2010-10-12 MED ORDER — GLUCOSE BLOOD VI STRP: ORAL_STRIP | Status: DC

## 2010-10-31 ENCOUNTER — Encounter: Payer: Self-pay | Admitting: Endocrinology

## 2010-10-31 ENCOUNTER — Ambulatory Visit (INDEPENDENT_AMBULATORY_CARE_PROVIDER_SITE_OTHER): Payer: Medicare Other | Admitting: Endocrinology

## 2010-10-31 DIAGNOSIS — E109 Type 1 diabetes mellitus without complications: Secondary | ICD-10-CM

## 2010-10-31 NOTE — Patient Instructions (Addendum)
reduce levemir to 32 units each morning, and 5 units each evening. However, if you are going to be active, take only 20 units.  If you cannot anticipate the activity, eat a light snack to avoid low blood sugar.  It is best to carry non-perishable snacks, so you'll always have it when you need it.   also take novolog 2 units whenever it is over 400.   Please make a follow-up appointment in 3 months.   (addendum: i did my part of the [facility] form.  i said the risk is low but not zero, and that he can drive.  i returned the form to patient).

## 2010-10-31 NOTE — Progress Notes (Signed)
Subjective:    Patient ID: Kenneth Sheppard, male    DOB: 1960/11/08, 50 y.o.   MRN: [id number]  HPI Since last visit here, pt says he has not had any further severe hypoglycemia.  He has mild episode twice a week.  He says he is able to anticipate the hypoglycemia better now that he is off the toprol.  The hypoglycemia usually happens in the afternoon.  He says this is due to his not being able to anticipate the activity.   Past Medical History  Diagnosis Date  . CORONARY ARTERY DISEASE 08/07/2006  . GERD 08/07/2006  . HYPOTHYROIDISM 08/07/2006  . Diabetes mellitus     Type 2  . Dyslipidemia   . Necrotizing respiratory granulomatosis     Necrotizing granuloma    Past Surgical History  Procedure Date  . Coronary artery bypass graft     History   Social History  . Marital Status: Married    Spouse Name: N/A    Number of Children: 2  . Years of Education: N/A   Occupational History  .      Disabled   Social History Main Topics  . Smoking status: Current Everyday Smoker -- 1.0 packs/day    Types: Cigarettes  . Smokeless tobacco: Not on file  . Alcohol Use: Not on file  . Drug Use: Not on file  . Sexually Active: Not on file   Other Topics Concern  . Not on file   Social History Narrative   Divorced several times    Current Outpatient Prescriptions on File Prior to Visit  Medication Sig Dispense Refill  . aspirin 81 MG tablet Take 81 mg by mouth daily.        . cetirizine (ZYRTEC) 10 MG tablet Take 10 mg by mouth daily.        . clopidogrel (PLAVIX) 75 MG tablet Take 75 mg by mouth daily.        Marland Kitchen gabapentin (NEURONTIN) 400 MG capsule Take 400 mg by mouth 3 (three) times daily.        Marland Kitchen glucose blood (GLUCOMETER ELITE TEST STRIPS) test strip Four times a day, variable glucoses. Dx 250.03  400 each  1  . insulin aspart (NOVOLOG FLEXPEN) 100 UNIT/ML injection 2 units for blood sugar over 400  10 mL  1  . Insulin Pen Needle (B-D UF III MINI PEN NEEDLES) 31G X 5 MM MISC 1  each by Other route 5 (five) times daily. Use as directed 5 times daily  500 each  1  . Insulin Syringe-Needle U-100 (INSULIN SYRINGE .5CC/31GX5/16") 31G X 5/16" 0.5 ML MISC Use once daily       . levothyroxine (SYNTHROID, LEVOTHROID) 150 MCG tablet Take 150 mcg by mouth daily.        Marland Kitchen lisinopril-hydrochlorothiazide (PRINZIDE,ZESTORETIC) 10-12.5 MG per tablet Take 1 tablet by mouth daily.        . sildenafil (VIAGRA) 100 MG tablet Take 1 tablet (100 mg total) by mouth as needed for erectile dysfunction.  20 tablet  1  . simvastatin (ZOCOR) 80 MG tablet Take 80 mg by mouth at bedtime.        . triamcinolone (KENALOG) 0.025 % ointment Apply topically 2 (two) times daily.  30 g  0    Allergies  Allergen Reactions  . Codeine     Family History  Problem Relation Age of Onset  . Diabetes Father     BP 106/58  Pulse 79  Temp(Src) 98.3  F (36.8 C) (Oral)  Ht 5\' 11"  (1.803 m)  Wt 151 lb (68.493 kg)  BMI 21.06 kg/m2  SpO2 96%  Review of Systems He has lost a few lbs, since last ov.      Objective:   Physical Exam VITAL SIGNS:  See vs page GENERAL: no distress PSYCH: Alert and oriented x 3.  Does not appear anxious nor depressed. SKIN:  Insulin injection sites at the anterior thighs are normal, except for a few ecchymoses.    Assessment & Plan:  Type 1 dm.  therapy limited by pt's need for a simple regimen, and by noncomplicance with cbg recording.  (severe hypoglycemia seems to be better, but he is still having mild hypoglycemia).

## 2010-11-07 ENCOUNTER — Other Ambulatory Visit: Payer: Self-pay | Admitting: Endocrinology

## 2010-11-07 NOTE — Telephone Encounter (Signed)
Done

## 2010-11-23 ENCOUNTER — Ambulatory Visit: Payer: Medicare Other | Admitting: Endocrinology

## 2010-12-04 ENCOUNTER — Other Ambulatory Visit: Payer: Self-pay | Admitting: Endocrinology

## 2011-01-15 ENCOUNTER — Other Ambulatory Visit: Payer: Self-pay | Admitting: Endocrinology

## 2011-01-19 ENCOUNTER — Ambulatory Visit (INDEPENDENT_AMBULATORY_CARE_PROVIDER_SITE_OTHER): Payer: Medicare Other | Admitting: Endocrinology

## 2011-01-19 ENCOUNTER — Encounter: Payer: Self-pay | Admitting: Endocrinology

## 2011-01-19 ENCOUNTER — Other Ambulatory Visit (INDEPENDENT_AMBULATORY_CARE_PROVIDER_SITE_OTHER): Payer: Medicare Other

## 2011-01-19 ENCOUNTER — Ambulatory Visit: Payer: Medicare Other

## 2011-01-19 DIAGNOSIS — E109 Type 1 diabetes mellitus without complications: Secondary | ICD-10-CM

## 2011-01-19 DIAGNOSIS — I959 Hypotension, unspecified: Secondary | ICD-10-CM | POA: Insufficient documentation

## 2011-01-19 MED ORDER — COSYNTROPIN 0.25 MG IJ SOLR
0.2500 mg | Freq: Once | INTRAMUSCULAR | Status: AC
  Administered 2011-01-19: 0.25 mg via INTRAMUSCULAR

## 2011-01-19 MED ORDER — GLUCOSE BLOOD VI STRP: 1.0000 | ORAL_STRIP | Freq: Four times a day (QID) | Status: DC

## 2011-01-19 NOTE — Progress Notes (Signed)
Subjective:    Patient ID: Kenneth Sheppard, male    DOB: Jun 03, 1960, 51 y.o.   MRN: [id number]  HPI Pt had a seizure 2 weeks ago.  He says this was not due to hypoglycemia.  He has an appointment with a neurologist in 2 weeks.   no cbg record, but states cbg's vary from 300-500.  There is no trend throughout the day.   Pt states few mos of intermittent moderate sxs of weakness throughout the body.  He says these sx are not related to cbg.  Has has assoc numbness.   Past Medical History  Diagnosis Date  . CORONARY ARTERY DISEASE 08/07/2006  . GERD 08/07/2006  . HYPOTHYROIDISM 08/07/2006  . Diabetes mellitus     Type 2  . Dyslipidemia   . Necrotizing respiratory granulomatosis     Necrotizing granuloma    Past Surgical History  Procedure Date  . Coronary artery bypass graft     History   Social History  . Marital Status: Married    Spouse Name: N/A    Number of Children: 2  . Years of Education: N/A   Occupational History  .      Disabled   Social History Main Topics  . Smoking status: Current Everyday Smoker -- 1.0 packs/day    Types: Cigarettes  . Smokeless tobacco: Not on file  . Alcohol Use: Not on file  . Drug Use: Not on file  . Sexually Active: Not on file   Other Topics Concern  . Not on file   Social History Narrative   Divorced several times    Current Outpatient Prescriptions on File Prior to Visit  Medication Sig Dispense Refill  . aspirin 81 MG tablet Take 81 mg by mouth daily.        . cetirizine (ZYRTEC) 10 MG tablet Take 10 mg by mouth daily.        . clopidogrel (PLAVIX) 75 MG tablet Take 75 mg by mouth daily.        Marland Kitchen gabapentin (NEURONTIN) 400 MG capsule Take 400 mg by mouth 3 (three) times daily.        . Insulin Pen Needle (B-D UF III MINI PEN NEEDLES) 31G X 5 MM MISC 1 each by Other route 5 (five) times daily. Use as directed 5 times daily  500 each  1  . Insulin Syringe-Needle U-100 (INSULIN SYRINGE .5CC/31GX5/16") 31G X 5/16" 0.5 ML MISC Use  once daily       . levothyroxine (SYNTHROID, LEVOTHROID) 150 MCG tablet Take 150 mcg by mouth daily.        Marland Kitchen lisinopril-hydrochlorothiazide (PRINZIDE,ZESTORETIC) 10-12.5 MG per tablet Take 1 tablet by mouth daily.        Marland Kitchen NOVOLOG FLEXPEN 100 UNIT/ML injection INJECT 2 UNITS FOR BLOOD   SUGAR OVER 400  15 mL  0  . sildenafil (VIAGRA) 100 MG tablet Take 1 tablet (100 mg total) by mouth as needed for erectile dysfunction.  20 tablet  1  . simvastatin (ZOCOR) 80 MG tablet Take 80 mg by mouth at bedtime.        . triamcinolone (KENALOG) 0.025 % ointment Apply topically 2 (two) times daily.  30 g  0    Allergies  Allergen Reactions  . Codeine     Family History  Problem Relation Age of Onset  . Diabetes Father     BP 82/58  Pulse 92  Temp(Src) 98 F (36.7 C) (Oral)  Ht 5\' 11"  (1.803  m)  Wt 145 lb (65.772 kg)  BMI 20.22 kg/m2  SpO2 96%  Review of Systems denies hypoglycemia and loc    Objective:   Physical Exam VITAL SIGNS:  See vs page GENERAL: no distress Pulses: dorsalis pedis intact bilat.  Feet: no deformity. no ulcer on the feet. feet are of normal color and temp. no edema. There are large healed ulcers at the right posterior ankle, and at the left ant tibial area.  Neuro: sensation is intact to touch on the feet.  Lab Results  Component Value Date   HGBA1C 9.8* 01/19/2011      Assessment & Plan:  DM, therapy limited by noncompliance.  i'll do the best i can. Weakness, uncertain etiology Seizure, uncertain etiology

## 2011-01-19 NOTE — Patient Instructions (Addendum)
continue levemir 32 units each morning, and 5 units each evening. However, if you are going to be active, take only 20 units.  If you cannot anticipate the activity, eat a light snack to avoid low blood sugar.  It is best to carry non-perishable snacks, so you'll always have it when you need it.   also take novolog 2 units whenever it is over 400.   Please make a follow-up appointment in 3 months.   check your blood sugar 4 times a day--before the 3 meals, and at bedtime.  also check if you have symptoms of your blood sugar being too high or too low.  please keep a record of the readings and bring it to your next appointment here.  please call us sooner if your blood sugar goes below 70, or if it stays over 200. (update: i left message on phone-tree:  rx as we discussed)

## 2011-01-21 NOTE — Progress Notes (Signed)
  Subjective:    Patient ID: Kenneth Sheppard, male    DOB: 1960/11/27, 51 y.o.   MRN: [id number]  HPI    Review of Systems     Objective:   Physical Exam    acth stimulation test is done: baseline cortisol level=5 then cosyntropin 250 mcg is given im 45 minutes later, cortisol level=24 (normal response)     Assessment & Plan:  Hypotension.   Adrenal insuff is excluded

## 2011-02-27 ENCOUNTER — Other Ambulatory Visit: Payer: Self-pay | Admitting: Endocrinology

## 2011-02-27 MED ORDER — GLUCOSE BLOOD VI STRP: 1.0000 | ORAL_STRIP | Freq: Four times a day (QID) | Status: DC

## 2011-02-27 NOTE — Telephone Encounter (Signed)
Rx resent for Onetouch Ultra test strips

## 2011-02-27 NOTE — Telephone Encounter (Signed)
Pt request refill on glucose blood (ONE TOUCH ULTRA TEST) test strips, please call into [facility] (did not receive refill from Jan)

## 2011-03-09 ENCOUNTER — Ambulatory Visit: Payer: Medicare Other | Admitting: Endocrinology

## 2011-03-19 ENCOUNTER — Encounter: Payer: Self-pay | Admitting: Endocrinology

## 2011-03-19 ENCOUNTER — Ambulatory Visit (INDEPENDENT_AMBULATORY_CARE_PROVIDER_SITE_OTHER): Payer: Medicare Other | Admitting: Endocrinology

## 2011-03-19 VITALS — BP 122/68 | HR 79 | Temp 97.9°F | Ht 71.0 in | Wt 150.4 lb

## 2011-03-19 DIAGNOSIS — E109 Type 1 diabetes mellitus without complications: Secondary | ICD-10-CM

## 2011-03-19 NOTE — Progress Notes (Signed)
Subjective:    Patient ID: Kenneth Sheppard, male    DOB: 1960/12/12, 51 y.o.   MRN: [id number]  HPI Pt returns for f/u of type 1 DM (1977).  Pt was last seen here 2 mos ago, when he reported having had a seizure.  He says he missed the neurologist appointment approx 10 days ago.  He says he needs to reschedule, but has not yet done so.  He says he has had no further seizures.  no cbg record, but states cbg was 25, last week.  He says this was due to a delay in his lunch.  Otherwise, he says he has hypoglycemia approx twice a week (usually before lunch).   He says he takes a widely varying insulin dosage. Past Medical History  Diagnosis Date  . CORONARY ARTERY DISEASE 08/07/2006  . GERD 08/07/2006  . HYPOTHYROIDISM 08/07/2006  . Diabetes mellitus     Type 2  . Dyslipidemia   . Necrotizing respiratory granulomatosis     Necrotizing granuloma    Past Surgical History  Procedure Date  . Coronary artery bypass graft     History   Social History  . Marital Status: Married    Spouse Name: N/A    Number of Children: 2  . Years of Education: N/A   Occupational History  .      Disabled   Social History Main Topics  . Smoking status: Current Everyday Smoker -- 1.0 packs/day    Types: Cigarettes  . Smokeless tobacco: Not on file  . Alcohol Use: Not on file  . Drug Use: Not on file  . Sexually Active: Not on file   Other Topics Concern  . Not on file   Social History Narrative   Divorced several times    Current Outpatient Prescriptions on File Prior to Visit  Medication Sig Dispense Refill  . aspirin 81 MG tablet Take 81 mg by mouth daily.        . clopidogrel (PLAVIX) 75 MG tablet Take 75 mg by mouth daily.        Marland Kitchen glucose blood (ONE TOUCH ULTRA TEST) test strip 1 each by Other route 4 (four) times daily. And lancets 4/day--variable cbg's 250.03  400 each  3  . insulin detemir (LEVEMIR) 100 UNIT/ML injection Inject into the skin subcutaneously 30 units in the morning and 5  units in the evening      . Insulin Pen Needle (B-D UF III MINI PEN NEEDLES) 31G X 5 MM MISC 1 each by Other route 5 (five) times daily. Use as directed 5 times daily  500 each  1  . Insulin Syringe-Needle U-100 (INSULIN SYRINGE .5CC/31GX5/16") 31G X 5/16" 0.5 ML MISC Use once daily       . levothyroxine (SYNTHROID, LEVOTHROID) 150 MCG tablet Take 150 mcg by mouth daily.        Marland Kitchen lisinopril-hydrochlorothiazide (PRINZIDE,ZESTORETIC) 10-12.5 MG per tablet Take 1 tablet by mouth daily.        Marland Kitchen NOVOLOG FLEXPEN 100 UNIT/ML injection INJECT 2 UNITS FOR BLOOD   SUGAR OVER 400  15 mL  0  . sildenafil (VIAGRA) 100 MG tablet Take 1 tablet (100 mg total) by mouth as needed for erectile dysfunction.  20 tablet  1  . simvastatin (ZOCOR) 80 MG tablet Take 80 mg by mouth at bedtime.        . triamcinolone (KENALOG) 0.025 % ointment Apply topically 2 (two) times daily.  30 g  0  Allergies  Allergen Reactions  . Lyrica (Pregabalin) Anaphylaxis  . Codeine     Family History  Problem Relation Age of Onset  . Diabetes Father     BP 122/68  Pulse 79  Temp(Src) 97.9 F (36.6 C) (Oral)  Ht 5\' 11"  (1.803 m)  Wt 150 lb 6.4 oz (68.221 kg)  BMI 20.98 kg/m2  SpO2 97%  Review of Systems Denies loc.      Objective:   Physical Exam VITAL SIGNS:  See vs page GENERAL: no distress NECK: There is no palpable thyroid enlargement.  No thyroid nodule is palpable.  No palpable lymphadenopathy at the anterior neck.      Assessment & Plan:  DM, therapy limited by noncompliance.  i'll do the best i can.

## 2011-03-19 NOTE — Patient Instructions (Addendum)
CC A Kearns, PA, [place]. Reduce levemir to 30 units each morning, and 5 in the evening.   Please come back for a follow-up appointment in 6 weeks.   check your blood sugar 4 times a day--before the 3 meals, and at bedtime.  also check if you have symptoms of your blood sugar being too high or too low.  please keep a record of the readings and bring it to your next appointment here.  please call us sooner if your blood sugar goes below 70, or if it stays over 200. i can only fill out the part of the [facility] form relating to the diabetes.  In order to do that, at a minimum, you would need to see the neurologist, and go 3 months without having any significant hypoglycemia.  There may be other conditions as well.

## 2011-03-24 ENCOUNTER — Other Ambulatory Visit: Payer: Self-pay | Admitting: Endocrinology

## 2011-04-20 ENCOUNTER — Ambulatory Visit: Payer: Medicare Other | Admitting: Endocrinology

## 2011-04-25 ENCOUNTER — Telehealth: Payer: Self-pay | Admitting: *Deleted

## 2011-04-25 NOTE — Telephone Encounter (Signed)
i believe it was received and sent for scanning

## 2011-04-25 NOTE — Telephone Encounter (Signed)
Pt wants to know if information from Dr. Mariam Dollar has been received so that his DMV paperwork can be completed

## 2011-04-26 NOTE — Telephone Encounter (Signed)
Left message for pt to callback office.  

## 2011-04-30 NOTE — Telephone Encounter (Signed)
Pt informed

## 2011-05-10 ENCOUNTER — Encounter: Payer: Self-pay | Admitting: Endocrinology

## 2011-05-10 ENCOUNTER — Ambulatory Visit (INDEPENDENT_AMBULATORY_CARE_PROVIDER_SITE_OTHER): Payer: Medicare Other | Admitting: Endocrinology

## 2011-05-10 ENCOUNTER — Other Ambulatory Visit (INDEPENDENT_AMBULATORY_CARE_PROVIDER_SITE_OTHER): Payer: Medicare Other

## 2011-05-10 VITALS — BP 102/68 | HR 84 | Temp 96.9°F | Ht 71.0 in | Wt 150.0 lb

## 2011-05-10 DIAGNOSIS — E1142 Type 2 diabetes mellitus with diabetic polyneuropathy: Secondary | ICD-10-CM

## 2011-05-10 DIAGNOSIS — E1149 Type 2 diabetes mellitus with other diabetic neurological complication: Secondary | ICD-10-CM

## 2011-05-10 DIAGNOSIS — IMO0002 Reserved for concepts with insufficient information to code with codable children: Secondary | ICD-10-CM | POA: Insufficient documentation

## 2011-05-10 DIAGNOSIS — E1049 Type 1 diabetes mellitus with other diabetic neurological complication: Secondary | ICD-10-CM

## 2011-05-10 DIAGNOSIS — E1065 Type 1 diabetes mellitus with hyperglycemia: Secondary | ICD-10-CM

## 2011-05-10 LAB — HEMOGLOBIN A1C: Hgb A1c MFr Bld: 9.1 % — ABNORMAL HIGH (ref 4.6–6.5)

## 2011-05-10 NOTE — Patient Instructions (Addendum)
CC A Kearns, PA, [place]. take levemir to 30 units each morning (only take 20 units if you are going to be active), and 5 in the evening.   Please come back for a follow-up appointment in 3 months.   check your blood sugar 4 times a day--before the 3 meals, and at bedtime.  also check if you have symptoms of your blood sugar being too high or too low.  please keep a record of the readings and bring it to your next appointment here.  please call us sooner if your blood sugar goes below 70, or if it stays over 200. i can only fill out the part of the [facility] form relating to the diabetes.   blood tests are being requested for you today.  You will receive a letter with results.

## 2011-05-10 NOTE — Progress Notes (Signed)
Subjective:    Patient ID: Kenneth Sheppard, male    DOB: 07/17/1960, 51 y.o.   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  He saw the neurologist in [place].  i have the EEG report, but not the consult report.  He says he has had no further seizures.  He takes levemir 30 am (20 if activity is anticipated), and 5 units in the evening.  Pt says his last episode of severe hypoglycemia was 6 mos ago.  no cbg record, but states cbg's are approx 200.   Past Medical History  Diagnosis Date  . CORONARY ARTERY DISEASE 08/07/2006  . GERD 08/07/2006  . HYPOTHYROIDISM 08/07/2006  . Diabetes mellitus     Type 2  . Dyslipidemia   . Necrotizing respiratory granulomatosis     Necrotizing granuloma    Past Surgical History  Procedure Date  . Coronary artery bypass graft     History   Social History  . Marital Status: Married    Spouse Name: N/A    Number of Children: 2  . Years of Education: N/A   Occupational History  .      Disabled   Social History Main Topics  . Smoking status: Current Everyday Smoker -- 1.0 packs/day    Types: Cigarettes  . Smokeless tobacco: Not on file  . Alcohol Use: Not on file  . Drug Use: Not on file  . Sexually Active: Not on file   Other Topics Concern  . Not on file   Social History Narrative   Divorced several times    Current Outpatient Prescriptions on File Prior to Visit  Medication Sig Dispense Refill  . aspirin 81 MG tablet Take 81 mg by mouth daily.        Marland Kitchen glucose blood (ONE TOUCH ULTRA TEST) test strip 1 each by Other route 4 (four) times daily. And lancets 4/day--variable cbg's 250.03  400 each  3  . insulin detemir (LEVEMIR) 100 UNIT/ML injection Inject into the skin subcutaneously 30 units in the morning and 5 units in the evening      . Insulin Pen Needle (B-D UF III MINI PEN NEEDLES) 31G X 5 MM MISC 1 each by Other route 5 (five) times daily. Use as  directed 5 times daily  500 each  1  . Insulin Syringe-Needle U-100 (INSULIN SYRINGE .5CC/31GX5/16") 31G X 5/16" 0.5 ML MISC Use once daily       . levothyroxine (SYNTHROID, LEVOTHROID) 150 MCG tablet Take 150 mcg by mouth daily.        Marland Kitchen lisinopril-hydrochlorothiazide (PRINZIDE,ZESTORETIC) 10-12.5 MG per tablet Take 1 tablet by mouth daily.        Marland Kitchen NOVOLOG FLEXPEN 100 UNIT/ML injection INJECT 2 UNITS FOR BLOOD   SUGAR OVER 400  15 mL  2  . simvastatin (ZOCOR) 80 MG tablet Take 80 mg by mouth at bedtime.        . sildenafil (VIAGRA) 100 MG tablet Take 1 tablet (100 mg total) by mouth as needed for erectile dysfunction.  20 tablet  1  . DISCONTD: cetirizine (ZYRTEC) 10 MG tablet Take 10 mg by mouth daily.        Marland Kitchen DISCONTD: gabapentin (NEURONTIN) 400 MG capsule Take 400 mg by mouth 3 (three) times daily.          Allergies  Allergen Reactions  . Lyrica (Pregabalin) Anaphylaxis  . Codeine  Family History  Problem Relation Age of Onset  . Diabetes Father     BP 102/68  Pulse 84  Temp(Src) 96.9 F (36.1 C) (Oral)  Ht 5\' 11"  (1.803 m)  Wt 150 lb (68.04 kg)  BMI 20.92 kg/m2  SpO2 96%   Review of Systems denies hypoglycemia.      Objective:   Physical Exam VITAL SIGNS:  See vs page. GENERAL: no distress. Pulses: dorsalis pedis intact bilat.  Feet: no deformity. no ulcer on the feet. feet are of normal color and temp. no edema. There are large healed ulcers at the right posterior ankle, and at the left ant tibial area.  Neuro: sensation is intact to touch on the feet, but severely decreased from normal.     Lab Results  Component Value Date   HGBA1C 9.1* 05/10/2011      Assessment & Plan:  DM.  needs increased rx.  therapy limited by pt's need for a simple regimen Recent seizure, uncertain etiology

## 2011-05-11 ENCOUNTER — Telehealth: Payer: Self-pay | Admitting: *Deleted

## 2011-05-11 NOTE — Telephone Encounter (Signed)
Called pt to inform of lab results, left message for pt to callback office. (Letter also mailed to pt) 

## 2011-05-14 NOTE — Telephone Encounter (Signed)
Left message for pt to callback office.  

## 2011-05-16 NOTE — Telephone Encounter (Signed)
Left message for pt to callback office.  

## 2011-05-17 NOTE — Telephone Encounter (Signed)
Left message for pt to callback office, letter with results mailed to pt, closing phone note.

## 2011-05-22 ENCOUNTER — Telehealth: Payer: Self-pay | Admitting: *Deleted

## 2011-05-22 NOTE — Telephone Encounter (Signed)
Patient states that since you have completed the DMV forms and no one else has completed them before-the DMV states that they need only page 2 completed.

## 2011-05-22 NOTE — Telephone Encounter (Signed)
Pt states that he has contacted [place] DMV regarding paperwork and that they stated that he only needed page 2 completed of the DMV form in order for his paperwork to be completed. Form printed and placed on MD's desk for completion

## 2011-05-22 NOTE — Telephone Encounter (Signed)
i only do the diabetes part, not page 2.  Pt has been advised of this before

## 2011-05-22 NOTE — Telephone Encounter (Signed)
Page 2 is for pcp.  i only do the diabetes page

## 2011-05-23 NOTE — Telephone Encounter (Signed)
Pt informed of MD's advisement. Paperwork has been faxed to PCP per patient to complete.

## 2011-06-01 DIAGNOSIS — R413 Other amnesia: Secondary | ICD-10-CM | POA: Insufficient documentation

## 2011-06-01 DIAGNOSIS — R569 Unspecified convulsions: Secondary | ICD-10-CM | POA: Insufficient documentation

## 2011-06-01 DIAGNOSIS — G479 Sleep disorder, unspecified: Secondary | ICD-10-CM | POA: Insufficient documentation

## 2011-06-06 ENCOUNTER — Telehealth: Payer: Self-pay | Admitting: Endocrinology

## 2011-06-06 NOTE — Telephone Encounter (Signed)
Pt needs DMV forms faxed to his PCP again, they state that they have not received them. Faxed DMV forms again to Dr. Abner Greenspan office. Pt informed.

## 2011-06-06 NOTE — Telephone Encounter (Signed)
The pt called and is hoping to speak with you about a form for his license.  His callback number is [phone].  Thanks!

## 2011-07-18 ENCOUNTER — Other Ambulatory Visit: Payer: Self-pay

## 2011-07-18 MED ORDER — INSULIN PEN NEEDLE 31G X 5 MM MISC: 1.0000 | Freq: Every day | Status: DC

## 2011-08-16 ENCOUNTER — Ambulatory Visit (INDEPENDENT_AMBULATORY_CARE_PROVIDER_SITE_OTHER): Payer: Medicare Other | Admitting: Endocrinology

## 2011-08-16 ENCOUNTER — Encounter: Payer: Self-pay | Admitting: Endocrinology

## 2011-08-16 VITALS — BP 122/70 | HR 82 | Temp 97.5°F | Ht 71.0 in | Wt 144.0 lb

## 2011-08-16 DIAGNOSIS — E119 Type 2 diabetes mellitus without complications: Secondary | ICD-10-CM

## 2011-08-16 DIAGNOSIS — E1065 Type 1 diabetes mellitus with hyperglycemia: Secondary | ICD-10-CM

## 2011-08-16 DIAGNOSIS — E1151 Type 2 diabetes mellitus with diabetic peripheral angiopathy without gangrene: Secondary | ICD-10-CM | POA: Insufficient documentation

## 2011-08-16 DIAGNOSIS — E1149 Type 2 diabetes mellitus with other diabetic neurological complication: Secondary | ICD-10-CM

## 2011-08-16 DIAGNOSIS — E1049 Type 1 diabetes mellitus with other diabetic neurological complication: Secondary | ICD-10-CM

## 2011-08-16 DIAGNOSIS — E1142 Type 2 diabetes mellitus with diabetic polyneuropathy: Secondary | ICD-10-CM

## 2011-08-16 NOTE — Patient Instructions (Addendum)
take levemir to 30 units each morning (only take 20 units if you are going to be active), and 5 in the evening.   Please come back for a follow-up appointment in 3 months.   check your blood sugar 4 times a day--before the 3 meals, and at bedtime.  also check if you have symptoms of your blood sugar being too high or too low.  please keep a record of the readings and bring it to your next appointment here.  please call us sooner if your blood sugar goes below 70, or if it stays over 200. i can only fill out the part of the [facility] form relating to the diabetes.   blood tests are being requested for you today.  You will receive a letter with results.

## 2011-08-16 NOTE — Progress Notes (Signed)
Subjective:    Patient ID: Kenneth Sheppard, male    DOB: Mar 18, 1960, 51 y.o.   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  He saw the neurologist in [place].  As of today, i still have the EEG report, but not the consult report.  He brings the [facility] form.  "substance abuse" is checked off as a dx.  Pt says dr Yetta Flock has declined to rx controlled substances for his leg pain, due to this hx.  He says he still ingests cocaine occasionally.  He takes levemir 30 am (20 if activity is anticipated), and 5 units in the evening.  Pt says his last episode of severe hypoglycemia was 6 mos ago.  no cbg record, but states cbg's are approx 200.   Past Medical History  Diagnosis Date  . CORONARY ARTERY DISEASE 08/07/2006  . GERD 08/07/2006  . HYPOTHYROIDISM 08/07/2006  . Diabetes mellitus     Type 2  . Dyslipidemia   . Necrotizing respiratory granulomatosis     Necrotizing granuloma    Past Surgical History  Procedure Date  . Coronary artery bypass graft     History   Social History  . Marital Status: Married    Spouse Name: N/A    Number of Children: 2  . Years of Education: N/A   Occupational History  .      Disabled   Social History Main Topics  . Smoking status: Current Everyday Smoker -- 1.0 packs/day    Types: Cigarettes  . Smokeless tobacco: Not on file  . Alcohol Use: Not on file  . Drug Use: Not on file  . Sexually Active: Not on file   Other Topics Concern  . Not on file   Social History Narrative   Divorced several times    Current Outpatient Prescriptions on File Prior to Visit  Medication Sig Dispense Refill  . aspirin 81 MG tablet Take 81 mg by mouth daily.        Marland Kitchen glucose blood (ONE TOUCH ULTRA TEST) test strip 1 each by Other route 4 (four) times daily. And lancets 4/day--variable cbg's 250.03  400 each  3  . insulin detemir (LEVEMIR) 100 UNIT/ML injection Inject into the skin  subcutaneously 30 units in the morning and 5 units in the evening      . Insulin Pen Needle (B-D UF III MINI PEN NEEDLES) 31G X 5 MM MISC 1 each by Other route 5 (five) times daily. Use as directed 5 times daily  500 each  1  . Insulin Syringe-Needle U-100 (INSULIN SYRINGE .5CC/31GX5/16") 31G X 5/16" 0.5 ML MISC Use once daily       . levothyroxine (SYNTHROID, LEVOTHROID) 150 MCG tablet Take 150 mcg by mouth daily.        Marland Kitchen lisinopril-hydrochlorothiazide (PRINZIDE,ZESTORETIC) 10-12.5 MG per tablet Take 1 tablet by mouth daily.        Marland Kitchen NOVOLOG FLEXPEN 100 UNIT/ML injection INJECT 2 UNITS FOR BLOOD   SUGAR OVER 400  15 mL  2  . simvastatin (ZOCOR) 80 MG tablet Take 80 mg by mouth at bedtime.        . sildenafil (VIAGRA) 100 MG tablet Take 1 tablet (100 mg total) by mouth as needed for erectile dysfunction.  20 tablet  1  . DISCONTD: cetirizine (ZYRTEC) 10 MG tablet Take 10 mg by mouth daily.        Marland Kitchen DISCONTD:  gabapentin (NEURONTIN) 400 MG capsule Take 400 mg by mouth 3 (three) times daily.          Allergies  Allergen Reactions  . Lyrica (Pregabalin) Anaphylaxis  . Codeine     Family History  Problem Relation Age of Onset  . Diabetes Father     BP 122/70  Pulse 82  Temp 97.5 F (36.4 C)  Ht 5\' 11"  (1.803 m)  Wt 144 lb (65.318 kg)  BMI 20.08 kg/m2  SpO2 97%  Review of Systems denies hypoglycemia since last ov    Objective:   Physical Exam VITAL SIGNS:  See vs page GENERAL: no distress SKIN:  Insulin injection sites at the anterior abdomen are normal.    Lab Results  Component Value Date   HGBA1C 9.1* 05/10/2011      Assessment & Plan:  DM.  therapy limited by pt's need for a simple regimen, and by noncompliance with cbg monitoring. drug abuse, chronic and ongoing.  Pt may be able to be qualified to drive from the standpoint of his DM, but not from the standpoint of drug abuse.

## 2011-09-04 ENCOUNTER — Other Ambulatory Visit: Payer: Self-pay

## 2011-09-04 MED ORDER — INSULIN PEN NEEDLE 31G X 5 MM MISC: 1.0000 | Freq: Every day | Status: DC

## 2011-09-04 MED ORDER — GLUCOSE BLOOD VI STRP: 1.0000 | ORAL_STRIP | Freq: Four times a day (QID) | Status: DC

## 2011-09-04 MED ORDER — INSULIN ASPART 100 UNIT/ML ~~LOC~~ SOLN: SUBCUTANEOUS | Status: DC

## 2011-09-04 MED ORDER — INSULIN DETEMIR 100 UNIT/ML ~~LOC~~ SOLN: SUBCUTANEOUS | Status: DC

## 2011-09-17 ENCOUNTER — Other Ambulatory Visit: Payer: Self-pay | Admitting: Endocrinology

## 2011-11-14 ENCOUNTER — Other Ambulatory Visit: Payer: Self-pay | Admitting: Endocrinology

## 2011-11-22 ENCOUNTER — Ambulatory Visit: Payer: Medicare Other | Admitting: Endocrinology

## 2011-11-23 ENCOUNTER — Ambulatory Visit (INDEPENDENT_AMBULATORY_CARE_PROVIDER_SITE_OTHER): Payer: Medicare Other | Admitting: Endocrinology

## 2011-11-23 ENCOUNTER — Encounter: Payer: Self-pay | Admitting: Endocrinology

## 2011-11-23 VITALS — BP 128/74 | HR 76 | Temp 98.4°F | Wt 145.0 lb

## 2011-11-23 DIAGNOSIS — E119 Type 2 diabetes mellitus without complications: Secondary | ICD-10-CM

## 2011-11-23 MED ORDER — INSULIN DETEMIR 100 UNIT/ML ~~LOC~~ SOLN: SUBCUTANEOUS | Status: DC

## 2011-11-23 NOTE — Progress Notes (Signed)
Subjective:    Patient ID: Kenneth Sheppard, male    DOB: 1960-07-09, 51 y.o.   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  He takes levemir 30 am (20 if activity is anticipated), and 5 units in the evening.  no cbg record, but states cbg's are well-controlled. Pt brings a DMV form, on which he has checked "no," for both substance abuse and neurological problems.  i last did this in 2009.  Since then, he has developed peripheral sensory neuropathy, and has admitted ongoing substance abuse problems as recently as his last ov.  He has a 2011 drug conviction.  Pt says dr Yetta Flock does drug testing.   Past Medical History  Diagnosis Date  . CORONARY ARTERY DISEASE 08/07/2006  . GERD 08/07/2006  . HYPOTHYROIDISM 08/07/2006  . Diabetes mellitus     Type 2  . Dyslipidemia   . Necrotizing respiratory granulomatosis     Necrotizing granuloma    Past Surgical History  Procedure Date  . Coronary artery bypass graft     History   Social History  . Marital Status: Married    Spouse Name: N/A    Number of Children: 2  . Years of Education: N/A   Occupational History  .      Disabled   Social History Main Topics  . Smoking status: Current Every Day Smoker -- 1.0 packs/day    Types: Cigarettes  . Smokeless tobacco: Not on file  . Alcohol Use: Not on file  . Drug Use: Not on file  . Sexually Active: Not on file   Other Topics Concern  . Not on file   Social History Narrative   Divorced several times    Current Outpatient Prescriptions on File Prior to Visit  Medication Sig Dispense Refill  . aspirin 81 MG tablet Take 81 mg by mouth daily.        . clopidogrel (PLAVIX) 75 MG tablet Take 75 mg by mouth daily.       Marland Kitchen glucose blood (ONE TOUCH ULTRA TEST) test strip 1 each by Other route 4 (four) times daily. And lancets 4/day--variable cbg's 250.03   400 each  3  . insulin aspart (NOVOLOG FLEXPEN) 100 UNIT/ML injection Inject 2 units for blood sugars over 400  15 mL  1  . insulin detemir (LEVEMIR) 100 UNIT/ML injection Inject into the skin subcutaneously 30 units in the morning and 5 units in the evening  30 mL  10  . Insulin Pen Needle (B-D UF III MINI PEN NEEDLES) 31G X 5 MM MISC 1 each by Other route 5 (five) times daily. Use as directed 5 times daily  500 each  1  . Insulin Pen Needle (B-D UF III MINI PEN NEEDLES) 31G X 5 MM MISC Use as directed twice daily dx 250.00  200 each  3  . Insulin Syringe-Needle U-100 (INSULIN SYRINGE .5CC/31GX5/16") 31G X 5/16" 0.5 ML MISC Use once daily       . levothyroxine (SYNTHROID, LEVOTHROID) 150 MCG tablet Take 150 mcg by mouth daily.        Marland Kitchen lisinopril-hydrochlorothiazide (PRINZIDE,ZESTORETIC) 10-12.5 MG per tablet Take 1 tablet by mouth daily.        . sildenafil (VIAGRA) 100 MG tablet Take 1 tablet (100 mg total) by mouth as needed for erectile dysfunction.  20 tablet  1  .  simvastatin (ZOCOR) 80 MG tablet Take 80 mg by mouth at bedtime.        . [DISCONTINUED] cetirizine (ZYRTEC) 10 MG tablet Take 10 mg by mouth daily.        . [DISCONTINUED] gabapentin (NEURONTIN) 400 MG capsule Take 400 mg by mouth 3 (three) times daily.          Allergies  Allergen Reactions  . Lyrica (Pregabalin) Anaphylaxis  . Codeine     Family History  Problem Relation Age of Onset  . Diabetes Father     BP 128/74  Pulse 76  Temp 98.4 F (36.9 C) (Oral)  Wt 145 lb (65.772 kg)  SpO2 98%  Review of Systems denies hypoglycemia.      Objective:   Physical Exam VITAL SIGNS:  See vs page. GENERAL: no distress. Pulses: dorsalis pedis intact bilat.  Feet: no deformity. no ulcer on the feet. feet are of normal color and temp. no edema. There are large healed ulcers at the right posterior ankle, and at the left ant tibial area.  Neuro: sensation is intact to touch on the feet, but severely decreased from  normal.      Assessment & Plan:  DM, apparently well-controlled.  His medical hx is much more complex when i last did the DMV form. Therefore, i can only do the DM part of it.   Pt is upset that i declined to do the form, and leaves the office without doing his a1c.

## 2011-11-23 NOTE — Patient Instructions (Addendum)
take levemir to 30 units each morning (only take 20 units if you are going to be active), and 5 in the evening.   Please come back for a follow-up appointment in 3 months.   check your blood sugar 4 times a day--before the 3 meals, and at bedtime.  also check if you have symptoms of your blood sugar being too high or too low.  please keep a record of the readings and bring it to your next appointment here.  please call us sooner if your blood sugar goes below 70, or if it stays over 200. i can only fill out the part of the [facility] form relating to the diabetes.  From the standpoint of the diabetes, yo can drive.   blood tests are being requested for you today.  We'll contact you with results.

## 2011-12-21 ENCOUNTER — Other Ambulatory Visit: Payer: Self-pay | Admitting: Endocrinology

## 2012-03-20 ENCOUNTER — Other Ambulatory Visit: Payer: Self-pay | Admitting: Endocrinology

## 2012-05-22 ENCOUNTER — Other Ambulatory Visit: Payer: Self-pay | Admitting: *Deleted

## 2012-05-22 ENCOUNTER — Other Ambulatory Visit: Payer: Self-pay | Admitting: Endocrinology

## 2012-05-22 MED ORDER — INSULIN ASPART 100 UNIT/ML FLEXPEN: PEN_INJECTOR | SUBCUTANEOUS | Status: DC

## 2012-06-17 ENCOUNTER — Other Ambulatory Visit: Payer: Self-pay | Admitting: Endocrinology

## 2012-06-18 ENCOUNTER — Other Ambulatory Visit: Payer: Self-pay | Admitting: *Deleted

## 2012-06-18 MED ORDER — INSULIN ASPART 100 UNIT/ML FLEXPEN: PEN_INJECTOR | SUBCUTANEOUS | Status: DC

## 2012-08-01 ENCOUNTER — Ambulatory Visit: Payer: Medicare Other | Admitting: Endocrinology

## 2012-08-25 ENCOUNTER — Ambulatory Visit (INDEPENDENT_AMBULATORY_CARE_PROVIDER_SITE_OTHER): Payer: Medicare Other | Admitting: Endocrinology

## 2012-08-25 ENCOUNTER — Telehealth: Payer: Self-pay

## 2012-08-25 VITALS — BP 120/70 | HR 90 | Ht 71.0 in | Wt 130.0 lb

## 2012-08-25 DIAGNOSIS — K222 Esophageal obstruction: Secondary | ICD-10-CM

## 2012-08-25 DIAGNOSIS — E119 Type 2 diabetes mellitus without complications: Secondary | ICD-10-CM

## 2012-08-25 MED ORDER — INSULIN DETEMIR 100 UNIT/ML FLEXPEN: 30.0000 [IU] | PEN_INJECTOR | Freq: Two times a day (BID) | SUBCUTANEOUS | Status: DC

## 2012-08-25 NOTE — Progress Notes (Signed)
Subjective:    Patient ID: Kenneth Sheppard, male    DOB: Jun 30, 1960, 52 y.o.   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  He takes levemir 30 units bid (20 units in am if activity is anticipated).  no cbg record, but states cbg's are well-controlled.  no cbg record, but states cbg's are mildly low approx once per week (usually in the afternoon).  Last episode of severe hypoglycemia was 6 mos ago.  This happened when lunch was delayed.  He says he never misses his insulin. Past Medical History  Diagnosis Date  . CORONARY ARTERY DISEASE 08/07/2006  . GERD 08/07/2006  . HYPOTHYROIDISM 08/07/2006  . Diabetes mellitus     Type 2  . Dyslipidemia   . Necrotizing respiratory granulomatosis     Necrotizing granuloma    Past Surgical History  Procedure Laterality Date  . Coronary artery bypass graft      History   Social History  . Marital Status: Married    Spouse Name: N/A    Number of Children: 2  . Years of Education: N/A   Occupational History  .      Disabled   Social History Main Topics  . Smoking status: Current Every Day Smoker -- 1.00 packs/day    Types: Cigarettes  . Smokeless tobacco: Not on file  . Alcohol Use: Not on file  . Drug Use: Not on file  . Sexual Activity: Not on file   Other Topics Concern  . Not on file   Social History Narrative   Divorced several times    Current Outpatient Prescriptions on File Prior to Visit  Medication Sig Dispense Refill  . aspirin 81 MG tablet Take 81 mg by mouth daily.        . clopidogrel (PLAVIX) 75 MG tablet Take 75 mg by mouth daily.       Marland Kitchen glucose blood (ONE TOUCH ULTRA TEST) test strip 1 each by Other route 4 (four) times daily. And lancets 4/day--variable cbg's 250.03  400 each  3  . insulin aspart (NOVOLOG FLEXPEN) 100 unit/mL SOLN FlexPen INJECT 2 UNITS OF  INSULIN FOR BLOOD SUGARS OVER 400  15 mL  0  . Insulin Pen Needle (B-D UF III MINI PEN NEEDLES) 31G X 5 MM MISC 1 each by Other route 5 (five) times daily. Use as directed 5 times daily  500 each  1  . Insulin Pen Needle (B-D UF III MINI PEN NEEDLES) 31G X 5 MM MISC Use as directed twice daily dx 250.00  200 each  3  . levothyroxine (SYNTHROID, LEVOTHROID) 150 MCG tablet Take 150 mcg by mouth daily.        Marland Kitchen lisinopril-hydrochlorothiazide (PRINZIDE,ZESTORETIC) 10-12.5 MG per tablet Take 1 tablet by mouth daily.        Marland Kitchen NOVOLOG FLEXPEN 100 UNIT/ML injection INJECT 2 UNITS FOR BLOOD   SUGARS OVER 400  15 mL  1  . simvastatin (ZOCOR) 80 MG tablet Take 80 mg by mouth at bedtime.        . sildenafil (VIAGRA) 100 MG tablet Take 1 tablet (100 mg total) by mouth as needed for erectile dysfunction.  20 tablet  1  . [DISCONTINUED] cetirizine (ZYRTEC) 10 MG tablet Take 10 mg by mouth daily.        . [DISCONTINUED] gabapentin (NEURONTIN)  400 MG capsule Take 400 mg by mouth 3 (three) times daily.         No current facility-administered medications on file prior to visit.    Allergies  Allergen Reactions  . Lyrica [Pregabalin] Anaphylaxis  . Codeine    Family History  Problem Relation Age of Onset  . Diabetes Father    BP 120/70  Pulse 90  Ht 5\' 11"  (1.803 m)  Wt 130 lb (58.968 kg)  BMI 18.14 kg/m2  SpO2 99%  Review of Systems He has lost weight.  Denies n/v.    Objective:   Physical Exam VITAL SIGNS:  See vs page GENERAL: no distress  Lab Results  Component Value Date   HGBA1C 8.7* 08/25/2012      Assessment & Plan:  DM: This insulin regimen was chosen from multiple options, for its simplicity.  The benefits of glycemic control must be weighed against the risks of hypoglycemia.   Episode of severe hypoglycemia.  This poses severe risk to his health. Noncompliance: this causes high risk to his health.

## 2012-08-25 NOTE — Patient Instructions (Addendum)
please call [phone] [facility]), and ask for physician referrals, to get a new primary doctor.   Please ask that the form for your shoes be sent here, and i'll fill it out.   Please come back for a follow-up appointment in 3 months blood tests are being requested for you today.  We'll contact you with results. check your blood sugar 4 times a day--before the 3 meals, and at bedtime.  also check if you have symptoms of your blood sugar being too high or too low.  please keep a record of the readings and bring it to your next appointment here.  please call us sooner if your blood sugar goes below 70, or if it stays over 200.

## 2012-08-25 NOTE — Telephone Encounter (Signed)
Pt stated he could not give ua sample

## 2012-11-11 ENCOUNTER — Ambulatory Visit: Payer: Medicare Other | Admitting: Endocrinology

## 2012-12-02 ENCOUNTER — Ambulatory Visit (INDEPENDENT_AMBULATORY_CARE_PROVIDER_SITE_OTHER): Payer: Medicare Other | Admitting: Endocrinology

## 2012-12-02 ENCOUNTER — Encounter: Payer: Self-pay | Admitting: Endocrinology

## 2012-12-02 VITALS — BP 100/68 | HR 90 | Temp 97.5°F | Resp 16 | Ht 71.0 in | Wt 126.9 lb

## 2012-12-02 DIAGNOSIS — E1049 Type 1 diabetes mellitus with other diabetic neurological complication: Secondary | ICD-10-CM

## 2012-12-02 DIAGNOSIS — E119 Type 2 diabetes mellitus without complications: Secondary | ICD-10-CM

## 2012-12-02 DIAGNOSIS — F1910 Other psychoactive substance abuse, uncomplicated: Secondary | ICD-10-CM

## 2012-12-02 DIAGNOSIS — Z23 Encounter for immunization: Secondary | ICD-10-CM

## 2012-12-02 LAB — HEMOGLOBIN A1C: Hgb A1c MFr Bld: 9.2 % — ABNORMAL HIGH (ref 4.6–6.5)

## 2012-12-02 MED ORDER — INSULIN DETEMIR 100 UNIT/ML FLEXPEN: PEN_INJECTOR | SUBCUTANEOUS | Status: DC

## 2012-12-02 NOTE — Patient Instructions (Addendum)
Please send the form for your shoes be sent here, and i'll fill it out.   Please come back for a follow-up appointment in 3 months blood tests are being requested for you today.  We'll contact you with results. check your blood sugar 4 times a day--before the 3 meals, and at bedtime.  also check if you have symptoms of your blood sugar being too high or too low.  please keep a record of the readings and bring it to your next appointment here.  please call us sooner if your blood sugar goes below 70, or if it stays over 200. Please increase the levemir to 35 units each morning (20 if you are going to be active), and 30 units in the evening.

## 2012-12-02 NOTE — Progress Notes (Signed)
Subjective:    Patient ID: Kenneth Sheppard, male    DOB: April 13, 1960, 52 y.o.   MRN: [id number]  HPI Pt returns for f/u of type 1 DM (dx'ed 1977: He has severe neuropathy of the lower extremities; he has associated CAD and left foot ulcer; last episode of severe hypoglycemia was in early 2014).  He takes levemir 30 units bid (20 units in am if activity is anticipated).   He says he never misses his insulin.  no cbg record, but states cbg's vary from 150-300.  It is in general higher as the day goes on.   Past Medical History  Diagnosis Date  . CORONARY ARTERY DISEASE 08/07/2006  . GERD 08/07/2006  . HYPOTHYROIDISM 08/07/2006  . Diabetes mellitus     Type 2  . Dyslipidemia   . Necrotizing respiratory granulomatosis     Necrotizing granuloma    Past Surgical History  Procedure Laterality Date  . Coronary artery bypass graft      History   Social History  . Marital Status: Married    Spouse Name: N/A    Number of Children: 2  . Years of Education: N/A   Occupational History  .      Disabled   Social History Main Topics  . Smoking status: Current Every Day Smoker -- 1.00 packs/day    Types: Cigarettes  . Smokeless tobacco: Not on file  . Alcohol Use: Not on file  . Drug Use: Not on file  . Sexual Activity: Not on file   Other Topics Concern  . Not on file   Social History Narrative   Divorced several times    Current Outpatient Prescriptions on File Prior to Visit  Medication Sig Dispense Refill  . aspirin 81 MG tablet Take 81 mg by mouth daily.        . clopidogrel (PLAVIX) 75 MG tablet Take 75 mg by mouth daily.       Marland Kitchen glucose blood (ONE TOUCH ULTRA TEST) test strip 1 each by Other route 4 (four) times daily. And lancets 4/day--variable cbg's 250.03  400 each  3  . insulin aspart (NOVOLOG FLEXPEN) 100 unit/mL SOLN FlexPen INJECT 2 UNITS OF INSULIN FOR BLOOD SUGARS OVER 400  15 mL  0  . Insulin Pen Needle (B-D UF III MINI PEN NEEDLES) 31G X 5 MM MISC 1 each by Other  route 5 (five) times daily. Use as directed 5 times daily  500 each  1  . Insulin Pen Needle (B-D UF III MINI PEN NEEDLES) 31G X 5 MM MISC Use as directed twice daily dx 250.00  200 each  3  . levothyroxine (SYNTHROID, LEVOTHROID) 150 MCG tablet Take 150 mcg by mouth daily.        Marland Kitchen lisinopril-hydrochlorothiazide (PRINZIDE,ZESTORETIC) 10-12.5 MG per tablet Take 1 tablet by mouth daily.        Marland Kitchen NOVOLOG FLEXPEN 100 UNIT/ML injection INJECT 2 UNITS FOR BLOOD   SUGARS OVER 400  15 mL  1  . simvastatin (ZOCOR) 80 MG tablet Take 80 mg by mouth at bedtime.        . sildenafil (VIAGRA) 100 MG tablet Take 1 tablet (100 mg total) by mouth as needed for erectile dysfunction.  20 tablet  1  . [DISCONTINUED] cetirizine (ZYRTEC) 10 MG tablet Take 10 mg by mouth daily.        . [DISCONTINUED] gabapentin (NEURONTIN) 400 MG capsule Take 400 mg by mouth 3 (three) times daily.  No current facility-administered medications on file prior to visit.    Allergies  Allergen Reactions  . Lyrica [Pregabalin] Anaphylaxis  . Codeine     Family History  Problem Relation Age of Onset  . Diabetes Father     BP 100/68  Pulse 90  Temp(Src) 97.5 F (36.4 C) (Oral)  Resp 16  Ht 5\' 11"  (1.803 m)  Wt 126 lb 14.4 oz (57.561 kg)  BMI 17.71 kg/m2   Review of Systems denies hypoglycemia.  He has lost weight.       Objective:   Physical Exam VITAL SIGNS:  See vs page GENERAL: no distress   Lab Results  Component Value Date   HGBA1C 9.2* 12/02/2012     Assessment & Plan:  DM: This insulin regimen was chosen from multiple options, for its simplicity.  The benefits of glycemic control must be weighed against the risks of hypoglycemia.   Neuropathy: this limits exercise rx of DM Weight loss: prob due to DM. Noncompliance with cbg recording: this causes high risk to his health.

## 2013-02-16 ENCOUNTER — Ambulatory Visit (INDEPENDENT_AMBULATORY_CARE_PROVIDER_SITE_OTHER): Payer: PRIVATE HEALTH INSURANCE | Admitting: Endocrinology

## 2013-02-16 ENCOUNTER — Other Ambulatory Visit: Payer: Self-pay | Admitting: Endocrinology

## 2013-02-16 ENCOUNTER — Encounter: Payer: Self-pay | Admitting: Endocrinology

## 2013-02-16 VITALS — BP 108/70 | HR 79 | Temp 97.9°F | Ht 71.0 in | Wt 127.0 lb

## 2013-02-16 DIAGNOSIS — E1065 Type 1 diabetes mellitus with hyperglycemia: Principal | ICD-10-CM

## 2013-02-16 DIAGNOSIS — E1049 Type 1 diabetes mellitus with other diabetic neurological complication: Secondary | ICD-10-CM

## 2013-02-16 LAB — HEMOGLOBIN A1C
HEMOGLOBIN A1C: 9.8 % — AB (ref <5.7)
MEAN PLASMA GLUCOSE: 235 mg/dL — AB (ref <117)

## 2013-02-16 MED ORDER — INSULIN ASPART 100 UNIT/ML FLEXPEN: PEN_INJECTOR | SUBCUTANEOUS | Status: DC

## 2013-02-16 MED ORDER — GLUCOSE BLOOD VI STRP: 1.0000 | ORAL_STRIP | Freq: Four times a day (QID) | Status: DC

## 2013-02-16 NOTE — Progress Notes (Signed)
Subjective:    Patient ID: Kenneth Sheppard, male    DOB: Apr 20, 1960, 53 y.o.   MRN: [id number]  HPI Pt returns for f/u of type 1 DM (dx'ed 1977; he has severe neuropathy of the lower extremities; he has associated CAD and left foot ulcer; he has been on insulin since dx; due to h/o noncompliance, he takes a simple BID insulin regimen; last episode of severe hypoglycemia was in early 2014; last episode of DKA was in 2011).  He takes levemir 30 units bid (20 units in am if activity is anticipated).   He says he never misses his insulin.  no cbg record, but states cbg's vary from 200-300.  He seldom has hypoglycemia, and these episodes are mild.  This happens when he takes the full 30 units in am, and is unable to anticipate activity.  It is highest in am.  He feels this is due to eating at hs, out of fear of severe hypoglycemia.   Past Medical History  Diagnosis Date  . CORONARY ARTERY DISEASE 08/07/2006  . GERD 08/07/2006  . HYPOTHYROIDISM 08/07/2006  . Diabetes mellitus     Type 2  . Dyslipidemia   . Necrotizing respiratory granulomatosis     Necrotizing granuloma    Past Surgical History  Procedure Laterality Date  . Coronary artery bypass graft      History   Social History  . Marital Status: Married    Spouse Name: N/A    Number of Children: 2  . Years of Education: N/A   Occupational History  .      Disabled   Social History Main Topics  . Smoking status: Current Every Day Smoker -- 1.00 packs/day    Types: Cigarettes  . Smokeless tobacco: Not on file  . Alcohol Use: Not on file  . Drug Use: Not on file  . Sexual Activity: Not on file   Other Topics Concern  . Not on file   Social History Narrative   Divorced several times    Current Outpatient Prescriptions on File Prior to Visit  Medication Sig Dispense Refill  . aspirin 81 MG tablet Take 81 mg by mouth daily.        . clopidogrel (PLAVIX) 75 MG tablet Take 75 mg by mouth daily.       . Insulin Detemir  (LEVEMIR FLEXTOUCH) 100 UNIT/ML SOPN 35 units each morning and 30 units each evening, and pen needles 2/day  30 pen  3  . Insulin Pen Needle (B-D UF III MINI PEN NEEDLES) 31G X 5 MM MISC 1 each by Other route 5 (five) times daily. Use as directed 5 times daily  500 each  1  . Insulin Pen Needle (B-D UF III MINI PEN NEEDLES) 31G X 5 MM MISC Use as directed twice daily dx 250.00  200 each  3  . levothyroxine (SYNTHROID, LEVOTHROID) 150 MCG tablet Take 150 mcg by mouth daily.        Marland Kitchen lisinopril-hydrochlorothiazide (PRINZIDE,ZESTORETIC) 10-12.5 MG per tablet Take 1 tablet by mouth daily.        Marland Kitchen NOVOLOG FLEXPEN 100 UNIT/ML injection INJECT 2 UNITS FOR BLOOD   SUGARS OVER 400  15 mL  1  . simvastatin (ZOCOR) 80 MG tablet Take 80 mg by mouth at bedtime.        . sildenafil (VIAGRA) 100 MG tablet Take 1 tablet (100 mg total) by mouth as needed for erectile dysfunction.  20 tablet  1  . [  DISCONTINUED] cetirizine (ZYRTEC) 10 MG tablet Take 10 mg by mouth daily.        . [DISCONTINUED] gabapentin (NEURONTIN) 400 MG capsule Take 400 mg by mouth 3 (three) times daily.         No current facility-administered medications on file prior to visit.    Allergies  Allergen Reactions  . Lyrica [Pregabalin] Anaphylaxis  . Codeine     Family History  Problem Relation Age of Onset  . Diabetes Father     BP 108/70  Pulse 79  Temp(Src) 97.9 F (36.6 C) (Oral)  Ht 5\' 11"  (1.803 m)  Wt 127 lb (57.607 kg)  BMI 17.72 kg/m2  SpO2 97%  Review of Systems Denies LOC and weight change    Objective:   Physical Exam VITAL SIGNS:  See vs page GENERAL: no distress      Assessment & Plan:  DM: DM: This insulin regimen was chosen from multiple options, for its simplicity.  The benefits of glycemic control must be weighed against the risks of hypoglycemia.  Noncompliance with cbg recording: this causes high risk to his health.  The next step is for him to record cbg's Severe hypoglycemia, recent.  In this  context, we can't just titrate insulin based on a1c.

## 2013-02-16 NOTE — Patient Instructions (Addendum)
Please come back for a follow-up appointment in 3 months.   blood and urine tests are being requested for you today.  We'll contact you with results.   check your blood sugar 4 times a day--before the 3 meals, and at bedtime.  also check if you have symptoms of your blood sugar being too high or too low.  please keep a record of the readings and bring it to your next appointment here.  please call us sooner if your blood sugar goes below 70, or if it stays over 200. Please increase the levemir to 35 units each morning (20 if you are going to be active), and 30 units in the evening.  If you are unable to anticipate the activity, eat a light snack with it.

## 2013-02-17 LAB — MICROALBUMIN / CREATININE URINE RATIO
CREATININE, URINE: 119.9 mg/dL
MICROALB/CREAT RATIO: 50.3 mg/g — AB (ref 0.0–30.0)
Microalb, Ur: 6.03 mg/dL — ABNORMAL HIGH (ref 0.00–1.89)

## 2013-05-15 ENCOUNTER — Other Ambulatory Visit: Payer: Self-pay

## 2013-05-15 ENCOUNTER — Ambulatory Visit: Payer: Medicare Other | Admitting: Endocrinology

## 2013-05-15 MED ORDER — INSULIN ASPART 100 UNIT/ML FLEXPEN: PEN_INJECTOR | SUBCUTANEOUS | Status: DC

## 2013-05-15 MED ORDER — INSULIN DETEMIR 100 UNIT/ML FLEXPEN: PEN_INJECTOR | SUBCUTANEOUS | Status: DC

## 2013-05-19 ENCOUNTER — Other Ambulatory Visit: Payer: Self-pay

## 2013-05-19 MED ORDER — GLUCOSE BLOOD VI STRP: ORAL_STRIP | Status: DC

## 2013-05-19 MED ORDER — ACCU-CHEK SOFTCLIX LANCET DEV MISC: Status: DC

## 2013-05-21 ENCOUNTER — Telehealth: Payer: Self-pay | Admitting: *Deleted

## 2013-05-21 ENCOUNTER — Other Ambulatory Visit: Payer: Self-pay | Admitting: *Deleted

## 2013-05-21 MED ORDER — INSULIN PEN NEEDLE 31G X 5 MM MISC: Status: DC

## 2013-05-21 NOTE — Telephone Encounter (Signed)
Max 10 units per day.  

## 2013-05-21 NOTE — Telephone Encounter (Signed)
Kenneth Sheppard, pharmacy tech with [facility] called asking for clarification on rx for Novolog. They need to know the maximum total daily dose of Novolog. Please advise.

## 2013-05-22 ENCOUNTER — Other Ambulatory Visit: Payer: Self-pay | Admitting: *Deleted

## 2013-05-22 MED ORDER — INSULIN ASPART 100 UNIT/ML FLEXPEN: PEN_INJECTOR | SUBCUTANEOUS | Status: DC

## 2013-05-22 NOTE — Telephone Encounter (Signed)
Dosage clarification. Sent new rx with maximum dosage amount.

## 2013-06-19 ENCOUNTER — Ambulatory Visit (INDEPENDENT_AMBULATORY_CARE_PROVIDER_SITE_OTHER): Payer: PRIVATE HEALTH INSURANCE | Admitting: Endocrinology

## 2013-06-19 ENCOUNTER — Encounter: Payer: Self-pay | Admitting: Endocrinology

## 2013-06-19 VITALS — BP 110/60 | HR 84 | Temp 98.4°F | Ht 71.0 in | Wt 123.0 lb

## 2013-06-19 DIAGNOSIS — E1065 Type 1 diabetes mellitus with hyperglycemia: Principal | ICD-10-CM

## 2013-06-19 DIAGNOSIS — E1049 Type 1 diabetes mellitus with other diabetic neurological complication: Secondary | ICD-10-CM

## 2013-06-19 LAB — HEMOGLOBIN A1C: Hgb A1c MFr Bld: 10 % — ABNORMAL HIGH (ref 4.6–6.5)

## 2013-06-19 NOTE — Progress Notes (Signed)
Subjective:    Patient ID: Kenneth Sheppard, male    DOB: Mar 08, 1960, 53 y.o.   MRN: [id number]  HPI Pt returns for f/u of type 1 DM (dx'ed 1977; he has severe neuropathy of the lower extremities; he has associated CAD and left foot ulcer; he has been on insulin since dx; due to h/o noncompliance, he takes a simple BID insulin regimen; last episode of severe hypoglycemia was in early 2014; last episode of DKA was in 2011).  no cbg record, but states cbg's continue to vary widely.  He says he has hypoglycemia approx twice a month.  He says this happens when he is active, but is not able to anticipate (and therefore takes less levemir).  He says he never misses the insulin.   Past Medical History  Diagnosis Date  . CORONARY ARTERY DISEASE 08/07/2006  . GERD 08/07/2006  . HYPOTHYROIDISM 08/07/2006  . Diabetes mellitus     Type 2  . Dyslipidemia   . Necrotizing respiratory granulomatosis     Necrotizing granuloma    Past Surgical History  Procedure Laterality Date  . Coronary artery bypass graft      History   Social History  . Marital Status: Married    Spouse Name: N/A    Number of Children: 2  . Years of Education: N/A   Occupational History  .      Disabled   Social History Main Topics  . Smoking status: Current Every Day Smoker -- 1.00 packs/day    Types: Cigarettes  . Smokeless tobacco: Not on file  . Alcohol Use: Not on file  . Drug Use: Not on file  . Sexual Activity: Not on file   Other Topics Concern  . Not on file   Social History Narrative   Divorced several times    Current Outpatient Prescriptions on File Prior to Visit  Medication Sig Dispense Refill  . aspirin 81 MG tablet Take 81 mg by mouth daily.        . clopidogrel (PLAVIX) 75 MG tablet Take 75 mg by mouth daily.       Marland Kitchen glucose blood (ACCU-CHEK AVIVA PLUS) test strip Use 4 times per day to check blood sugar.  360 each  1  . insulin aspart (NOVOLOG FLEXPEN) 100 UNIT/ML FlexPen INJECT 2 UNITS OF  INSULIN FOR BLOOD SUGARS OVER 400. Maximum dose of 10 units daily.  15 mL  1  . Insulin Detemir (LEVEMIR FLEXTOUCH) 100 UNIT/ML Pen 35 units each morning and 30 units each evening, and pen needles 2/day  30 pen  3  . Insulin Pen Needle (B-D UF III MINI PEN NEEDLES) 31G X 5 MM MISC 1 each by Other route 5 (five) times daily. Use as directed 5 times daily  500 each  1  . Insulin Pen Needle (B-D UF III MINI PEN NEEDLES) 31G X 5 MM MISC Use as directed 3 times daily. Dx code: 250.63  300 each  3  . Lancet Devices (ACCU-CHEK SOFTCLIX) lancets Use 4 times per day to check blood sugar.  360 each  1  . levothyroxine (SYNTHROID, LEVOTHROID) 150 MCG tablet Take 150 mcg by mouth daily.        Marland Kitchen lisinopril-hydrochlorothiazide (PRINZIDE,ZESTORETIC) 10-12.5 MG per tablet Take 1 tablet by mouth daily.        Marland Kitchen NOVOLOG FLEXPEN 100 UNIT/ML injection INJECT 2 UNITS FOR BLOOD   SUGARS OVER 400  15 mL  1  . omeprazole (PRILOSEC) 40 MG  capsule Take 40 mg by mouth daily. Take 1 tab daily      . simvastatin (ZOCOR) 80 MG tablet Take 80 mg by mouth at bedtime.        . sildenafil (VIAGRA) 100 MG tablet Take 1 tablet (100 mg total) by mouth as needed for erectile dysfunction.  20 tablet  1  . [DISCONTINUED] cetirizine (ZYRTEC) 10 MG tablet Take 10 mg by mouth daily.        . [DISCONTINUED] gabapentin (NEURONTIN) 400 MG capsule Take 400 mg by mouth 3 (three) times daily.         No current facility-administered medications on file prior to visit.    Allergies  Allergen Reactions  . Lyrica [Pregabalin] Anaphylaxis  . Codeine     Family History  Problem Relation Age of Onset  . Diabetes Father     BP 110/60  Pulse 84  Temp(Src) 98.4 F (36.9 C) (Oral)  Ht 5\' 11"  (1.803 m)  Wt 123 lb (55.792 kg)  BMI 17.16 kg/m2  SpO2 96%      Review of Systems Denies LOC.  He has gained a few lbs.      Objective:   Physical Exam Pulses: dorsalis pedis intact bilat.  Feet: no deformity. no ulcer on the feet. feet  are of normal color and temp. no edema. Dry skin is noted. There is a healed ulcer at the left leg. at the left ant tibial area is granuloma annuale.  Neuro: sensation is intact to touch on the feet, but severely decreased from normal.   Lab Results  Component Value Date   HGBA1C 10.0* 06/19/2013      Assessment & Plan:  DM: severe exacerbation. Noncompliance with cbg recording: I'll work around this as best I can    Patient is advised the following: Patient Instructions  Please come back for a follow-up appointment in 3 months.   A diabetes blood requested for you today.  We'll contact you with results.   check your blood sugar 4 times a day--before the 3 meals, and at bedtime.  also check if you have symptoms of your blood sugar being too high or too low.  please keep a record of the readings and bring it to your next appointment here.  please call us sooner if your blood sugar goes below 70, or if it stays over 200. Please increase the levemir to 35 units each morning (20 if you are going to be active), and 30 units in the evening.  If you are unable to anticipate the activity, eat a light snack with it.

## 2013-06-19 NOTE — Patient Instructions (Addendum)
Please come back for a follow-up appointment in 3 months.   A diabetes blood requested for you today.  We'll contact you with results.   check your blood sugar 4 times a day--before the 3 meals, and at bedtime.  also check if you have symptoms of your blood sugar being too high or too low.  please keep a record of the readings and bring it to your next appointment here.  please call us sooner if your blood sugar goes below 70, or if it stays over 200. Please increase the levemir to 35 units each morning (20 if you are going to be active), and 30 units in the evening.  If you are unable to anticipate the activity, eat a light snack with it.

## 2013-09-18 ENCOUNTER — Ambulatory Visit: Payer: PRIVATE HEALTH INSURANCE | Admitting: Endocrinology

## 2013-10-08 ENCOUNTER — Ambulatory Visit (INDEPENDENT_AMBULATORY_CARE_PROVIDER_SITE_OTHER): Payer: PRIVATE HEALTH INSURANCE | Admitting: Endocrinology

## 2013-10-08 ENCOUNTER — Encounter: Payer: Self-pay | Admitting: Endocrinology

## 2013-10-08 VITALS — BP 130/74 | HR 83 | Temp 97.8°F | Ht 71.0 in | Wt 126.0 lb

## 2013-10-08 DIAGNOSIS — IMO0002 Reserved for concepts with insufficient information to code with codable children: Secondary | ICD-10-CM

## 2013-10-08 DIAGNOSIS — Z23 Encounter for immunization: Secondary | ICD-10-CM

## 2013-10-08 DIAGNOSIS — E1049 Type 1 diabetes mellitus with other diabetic neurological complication: Secondary | ICD-10-CM

## 2013-10-08 DIAGNOSIS — E1041 Type 1 diabetes mellitus with diabetic mononeuropathy: Secondary | ICD-10-CM

## 2013-10-08 DIAGNOSIS — E1065 Type 1 diabetes mellitus with hyperglycemia: Principal | ICD-10-CM

## 2013-10-08 LAB — HEMOGLOBIN A1C: Hgb A1c MFr Bld: 9.8 % — ABNORMAL HIGH (ref 4.6–6.5)

## 2013-10-08 NOTE — Progress Notes (Signed)
Subjective:    Patient ID: Kenneth Sheppard, male    DOB: Nov 01, 1960, 53 y.o.   MRN: [id number]  HPI Pt returns for f/u of diabetes mellitus: DM type: 1 Dx'ed: 1977 Complications: severe painful neuropathy of the lower extremities, CAD, and left foot ulcer Therapy: insulin since dx DKA: last 2011 Severe hypoglycemia: last 2014 Pancreatitis: never Other: due to h/o noncompliance, he takes a simple BID insulin regimen Interval history: He seldom has hypoglycemia, and these episodes are mild.  This usually happens in the late afternoon.  no cbg record, but states cbg's are highest in am.  pt states he feels well in general, except for chronic neuropathic pain. Past Medical History  Diagnosis Date  . CORONARY ARTERY DISEASE 08/07/2006  . GERD 08/07/2006  . HYPOTHYROIDISM 08/07/2006  . Diabetes mellitus     Type 2  . Dyslipidemia   . Necrotizing respiratory granulomatosis     Necrotizing granuloma    Past Surgical History  Procedure Laterality Date  . Coronary artery bypass graft      History   Social History  . Marital Status: Married    Spouse Name: N/A    Number of Children: 2  . Years of Education: N/A   Occupational History  .      Disabled   Social History Main Topics  . Smoking status: Current Every Day Smoker -- 1.00 packs/day    Types: Cigarettes  . Smokeless tobacco: Not on file  . Alcohol Use: Not on file  . Drug Use: Not on file  . Sexual Activity: Not on file   Other Topics Concern  . Not on file   Social History Narrative   Divorced several times    Current Outpatient Prescriptions on File Prior to Visit  Medication Sig Dispense Refill  . aspirin 81 MG tablet Take 81 mg by mouth daily.        . clopidogrel (PLAVIX) 75 MG tablet Take 75 mg by mouth daily.       Marland Kitchen glucose blood (ACCU-CHEK AVIVA PLUS) test strip Use 4 times per day to check blood sugar.  360 each  1  . insulin aspart (NOVOLOG FLEXPEN) 100 UNIT/ML FlexPen INJECT 2 UNITS OF INSULIN FOR  BLOOD SUGARS OVER 400. Maximum dose of 10 units daily.  15 mL  1  . Insulin Pen Needle (B-D UF III MINI PEN NEEDLES) 31G X 5 MM MISC 1 each by Other route 5 (five) times daily. Use as directed 5 times daily  500 each  1  . Insulin Pen Needle (B-D UF III MINI PEN NEEDLES) 31G X 5 MM MISC Use as directed 3 times daily. Dx code: 250.63  300 each  3  . ipratropium-albuterol (DUONEB) 0.5-2.5 (3) MG/3ML SOLN       . Lancet Devices (ACCU-CHEK SOFTCLIX) lancets Use 4 times per day to check blood sugar.  360 each  1  . levothyroxine (SYNTHROID, LEVOTHROID) 150 MCG tablet Take 150 mcg by mouth daily.       Marland Kitchen lisinopril-hydrochlorothiazide (PRINZIDE,ZESTORETIC) 10-12.5 MG per tablet Take 1 tablet by mouth daily.        Marland Kitchen NOVOLOG FLEXPEN 100 UNIT/ML injection INJECT 2 UNITS FOR BLOOD   SUGARS OVER 400  15 mL  1  . omeprazole (PRILOSEC) 40 MG capsule Take 40 mg by mouth daily. Take 1 tab daily      . simvastatin (ZOCOR) 80 MG tablet Take 80 mg by mouth at bedtime.        Marland Kitchen  Vitamin D, Ergocalciferol, (DRISDOL) 50000 UNITS CAPS capsule Take 50,000 Units by mouth every 7 (seven) days.      . sildenafil (VIAGRA) 100 MG tablet Take 1 tablet (100 mg total) by mouth as needed for erectile dysfunction.  20 tablet  1  . [DISCONTINUED] cetirizine (ZYRTEC) 10 MG tablet Take 10 mg by mouth daily.        . [DISCONTINUED] gabapentin (NEURONTIN) 400 MG capsule Take 400 mg by mouth 3 (three) times daily.         No current facility-administered medications on file prior to visit.    Allergies  Allergen Reactions  . Lyrica [Pregabalin] Anaphylaxis  . Codeine     Family History  Problem Relation Age of Onset  . Diabetes Father     BP 130/74  Pulse 83  Temp(Src) 97.8 F (36.6 C) (Oral)  Ht 5\' 11"  (1.803 m)  Wt 126 lb (57.153 kg)  BMI 17.58 kg/m2  SpO2 98%  Review of Systems Denies LOC and weight change.     Objective:   Physical Exam VITAL SIGNS:  See vs page GENERAL: no distress Pulses: dorsalis pedis  intact bilat.  Feet: no deformity.  no edema. Skin: no ulcer on the feet. feet are of normal color and temp.  Dry skin is noted. There is a healed ulcer at the left leg. at the left ant tibial area is granuloma annuale.  There is an old healed burn at the right ankle.  Neuro: sensation is intact to touch on the feet, but severely decreased from normal.    Lab Results  Component Value Date   HGBA1C 9.8* 10/08/2013       Assessment & Plan:  DM: moderate exacerbation Noncompliance with cbg recording: I'll work around this as best I can.  Patient is advised the following: Patient Instructions  Please come back for a follow-up appointment in 3 months.   Please increase the levemir to 35 units twice a day (20 if you are going to be active)If you are unable to anticipate the activity, eat a light snack with it..   A diabetes blood requested for you today.  We'll contact you with results.   check your blood sugar 4 times a day--before the 3 meals, and at bedtime.  also check if you have symptoms of your blood sugar being too high or too low.  please keep a record of the readings and bring it to your next appointment here.  please call us sooner if your blood sugar goes below 70, or if it stays over 200.

## 2013-10-08 NOTE — Patient Instructions (Addendum)
Please come back for a follow-up appointment in 3 months.   Please increase the levemir to 35 units twice a day (20 if you are going to be active)If you are unable to anticipate the activity, eat a light snack with it..   A diabetes blood requested for you today.  We'll contact you with results.   check your blood sugar 4 times a day--before the 3 meals, and at bedtime.  also check if you have symptoms of your blood sugar being too high or too low.  please keep a record of the readings and bring it to your next appointment here.  please call us sooner if your blood sugar goes below 70, or if it stays over 200.

## 2013-10-29 ENCOUNTER — Other Ambulatory Visit: Payer: Self-pay | Admitting: Endocrinology

## 2013-12-21 ENCOUNTER — Telehealth: Payer: Self-pay | Admitting: Endocrinology

## 2013-12-21 NOTE — Telephone Encounter (Signed)
Lvom advising pt of note below. Requested call back if pt would like to discuss.  

## 2013-12-21 NOTE — Telephone Encounter (Signed)
please call patient: i received DMV form.  This needs to go to your PCP.  i could do the diabetes part, but it would not be helpful to you.

## 2013-12-22 ENCOUNTER — Telehealth: Payer: Self-pay | Admitting: Endocrinology

## 2013-12-22 NOTE — Telephone Encounter (Signed)
Patient ask if you were finished with his form could you fax it over to  [employer] Fax # [phone]

## 2013-12-22 NOTE — Telephone Encounter (Signed)
Spoke with pt and advised that his DMV paper work form has been faxed back to his PCP for completion. Pt voiced understanding.

## 2014-01-11 ENCOUNTER — Ambulatory Visit: Payer: PRIVATE HEALTH INSURANCE | Admitting: Endocrinology

## 2014-01-12 ENCOUNTER — Telehealth: Payer: Self-pay | Admitting: Endocrinology

## 2014-01-13 NOTE — Telephone Encounter (Signed)
Error

## 2014-01-28 ENCOUNTER — Ambulatory Visit: Payer: Medicaid Other | Admitting: Endocrinology

## 2014-02-04 ENCOUNTER — Encounter: Payer: Self-pay | Admitting: Endocrinology

## 2014-02-04 ENCOUNTER — Ambulatory Visit (INDEPENDENT_AMBULATORY_CARE_PROVIDER_SITE_OTHER): Payer: PRIVATE HEALTH INSURANCE | Admitting: Endocrinology

## 2014-02-04 VITALS — BP 122/78 | HR 85 | Temp 97.4°F | Ht 71.0 in | Wt 135.0 lb

## 2014-02-04 DIAGNOSIS — E1041 Type 1 diabetes mellitus with diabetic mononeuropathy: Secondary | ICD-10-CM

## 2014-02-04 DIAGNOSIS — E1065 Type 1 diabetes mellitus with hyperglycemia: Principal | ICD-10-CM

## 2014-02-04 DIAGNOSIS — IMO0002 Reserved for concepts with insufficient information to code with codable children: Secondary | ICD-10-CM

## 2014-02-04 DIAGNOSIS — E1049 Type 1 diabetes mellitus with other diabetic neurological complication: Secondary | ICD-10-CM

## 2014-02-04 LAB — HEMOGLOBIN A1C: Hgb A1c MFr Bld: 11.5 % — ABNORMAL HIGH (ref 4.6–6.5)

## 2014-02-04 LAB — MICROALBUMIN / CREATININE URINE RATIO
Creatinine,U: 58.1 mg/dL
MICROALB UR: 4.3 mg/dL — AB (ref 0.0–1.9)
MICROALB/CREAT RATIO: 7.4 mg/g (ref 0.0–30.0)

## 2014-02-04 MED ORDER — INSULIN DETEMIR 100 UNIT/ML FLEXPEN: 38.0000 [IU] | PEN_INJECTOR | Freq: Two times a day (BID) | SUBCUTANEOUS | Status: DC

## 2014-02-04 NOTE — Patient Instructions (Addendum)
Please come back for a follow-up appointment in 3 months.   Please increase the levemir to 38 units twice a day (20 if you are going to be active). If you are unable to anticipate the activity, eat a light snack with it..   Blood and urine tests are being requested for you today.  We'll let you know about the results.  check your blood sugar 4 times a day--before the 3 meals, and at bedtime.  also check if you have symptoms of your blood sugar being too high or too low.  please keep a record of the readings and bring it to your next appointment here.  please call us sooner if your blood sugar goes below 70, or if it stays over 200.  On this type of insulin schedule, you should eat meals on a regular schedule.  If a meal is missed or significantly delayed, your blood sugar could go low.

## 2014-02-04 NOTE — Progress Notes (Signed)
Subjective:    Patient ID: Kenneth Sheppard, male    DOB: [birth year], 54 y.o.   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  It happened in the afternoon, with activity.  He took the full 35 units that am.  no cbg record, but states cbg's vary from 250-300.  There is no trend throughout the day. Past Medical History  Diagnosis Date  . CORONARY ARTERY DISEASE 08/07/2006  . GERD 08/07/2006  . HYPOTHYROIDISM 08/07/2006  . Diabetes mellitus     Type 2  . Dyslipidemia   . Necrotizing respiratory granulomatosis     Necrotizing granuloma    Past Surgical History  Procedure Laterality Date  . Coronary artery bypass graft      History   Social History  . Marital Status: Married    Spouse Name: N/A    Number of Children: 2  . Years of Education: N/A   Occupational History  .      Disabled   Social History Main Topics  . Smoking status: Current Every Day Smoker -- 1.00 packs/day    Types: Cigarettes  . Smokeless tobacco: Not on file  . Alcohol Use: Not on file  . Drug Use: Not on file  . Sexual Activity: Not on file   Other Topics Concern  . Not on file   Social History Narrative   Divorced several times    Current Outpatient Prescriptions on File Prior to Visit  Medication Sig Dispense Refill  . aspirin 81 MG tablet Take 81 mg by mouth daily.      . clopidogrel (PLAVIX) 75 MG tablet Take 75 mg by mouth daily.     . Insulin Pen Needle (B-D UF III MINI PEN NEEDLES) 31G X 5 MM MISC 1 each by Other route 5 (five) times daily. Use as directed 5 times daily 500 each 1  . Insulin Pen Needle  (B-D UF III MINI PEN NEEDLES) 31G X 5 MM MISC Use as directed 3 times daily. Dx code: 250.63 300 each 3  . ipratropium-albuterol (DUONEB) 0.5-2.5 (3) MG/3ML SOLN     . Lancet Devices (ACCU-CHEK SOFTCLIX) lancets Use 4 times per day to check blood sugar. 360 each 1  . levothyroxine (SYNTHROID, LEVOTHROID) 150 MCG tablet Take 150 mcg by mouth daily.     Marland Kitchen lisinopril-hydrochlorothiazide (PRINZIDE,ZESTORETIC) 10-12.5 MG per tablet Take 1 tablet by mouth daily.      Marland Kitchen NOVOLOG FLEXPEN 100 UNIT/ML FlexPen Inject 2 units of insulin  for blood sugars over 400.  Maximum dose of 10 units  daily 15 mL 1  . omeprazole (PRILOSEC) 40 MG capsule Take 40 mg by mouth daily. Take 1 tab daily    . ONE TOUCH ULTRA TEST test strip Test blood sugar 4 times  daily 400 each 1  . ONETOUCH DELICA LANCETS FINE MISC Test blood sugar 4 times  daily 400 each 1  . simvastatin (ZOCOR) 80 MG tablet Take 80 mg by mouth at bedtime.      . Vitamin D, Ergocalciferol, (DRISDOL) 50000 UNITS CAPS capsule Take 50,000 Units by mouth every 7 (seven) days.    Marland Kitchen  sildenafil (VIAGRA) 100 MG tablet Take 1 tablet (100 mg total) by mouth as needed for erectile dysfunction. 20 tablet 1  . [DISCONTINUED] cetirizine (ZYRTEC) 10 MG tablet Take 10 mg by mouth daily.      . [DISCONTINUED] gabapentin (NEURONTIN) 400 MG capsule Take 400 mg by mouth 3 (three) times daily.       No current facility-administered medications on file prior to visit.    Allergies  Allergen Reactions  . Lyrica [Pregabalin] Anaphylaxis  . Codeine     Family History  Problem Relation Age of Onset  . Diabetes Father     BP 122/78 mmHg  Pulse 85  Temp(Src) 97.4 F (36.3 C) (Oral)  Ht 5\' 11"  (1.803 m)  Wt 135 lb (61.236 kg)  BMI 18.84 kg/m2  SpO2 94%  Review of Systems He denies LOC and weight change.      Objective:   Physical Exam VITAL SIGNS:  See vs page GENERAL: no distress Pulses: dorsalis pedis intact bilat.  Feet: no deformity. no edema.  Skin: no  ulcer on the feet. feet are of normal color and temp. Dry skin is noted. There is a healed ulcer at the left leg. at the left ant tibial area is granuloma annuale. There is an old healed burn at the right ankle.  Neuro: sensation is intact to touch on the feet, but severely decreased from normal.    Lab Results  Component Value Date   HGBA1C 11.5* 02/04/2014      Assessment & Plan:  DM: severe exacerbation. Noncompliance with cbg recording: I'll work around this as best I can, which is to titrate insulin according to a1c.     Patient is advised the following: Patient Instructions  Please come back for a follow-up appointment in 3 months.   Please increase the levemir to 38 units twice a day (20 if you are going to be active). If you are unable to anticipate the activity, eat a light snack with it..   Blood and urine tests are being requested for you today.  We'll let you know about the results.  check your blood sugar 4 times a day--before the 3 meals, and at bedtime.  also check if you have symptoms of your blood sugar being too high or too low.  please keep a record of the readings and bring it to your next appointment here.  please call us sooner if your blood sugar goes below 70, or if it stays over 200.  On this type of insulin schedule, you should eat meals on a regular schedule.  If a meal is missed or significantly delayed, your blood sugar could go low.

## 2014-03-08 ENCOUNTER — Other Ambulatory Visit: Payer: Self-pay | Admitting: Endocrinology

## 2014-05-06 ENCOUNTER — Ambulatory Visit: Payer: Medicaid Other | Admitting: Endocrinology

## 2014-06-09 ENCOUNTER — Ambulatory Visit (INDEPENDENT_AMBULATORY_CARE_PROVIDER_SITE_OTHER): Payer: PRIVATE HEALTH INSURANCE | Admitting: Endocrinology

## 2014-06-09 ENCOUNTER — Encounter: Payer: Self-pay | Admitting: Endocrinology

## 2014-06-09 VITALS — BP 124/70 | HR 67 | Temp 97.5°F | Wt 130.0 lb

## 2014-06-09 DIAGNOSIS — E1049 Type 1 diabetes mellitus with other diabetic neurological complication: Secondary | ICD-10-CM

## 2014-06-09 DIAGNOSIS — IMO0002 Reserved for concepts with insufficient information to code with codable children: Secondary | ICD-10-CM

## 2014-06-09 DIAGNOSIS — E1065 Type 1 diabetes mellitus with hyperglycemia: Principal | ICD-10-CM

## 2014-06-09 DIAGNOSIS — E1041 Type 1 diabetes mellitus with diabetic mononeuropathy: Secondary | ICD-10-CM

## 2014-06-09 LAB — TSH: TSH: 9.88 u[IU]/mL — ABNORMAL HIGH (ref 0.35–4.50)

## 2014-06-09 LAB — HEMOGLOBIN A1C: HEMOGLOBIN A1C: 9.9 % — AB (ref 4.6–6.5)

## 2014-06-09 MED ORDER — LEVOTHYROXINE SODIUM 175 MCG PO TABS: 175.0000 ug | ORAL_TABLET | Freq: Every day | ORAL | Status: DC

## 2014-06-09 NOTE — Progress Notes (Signed)
Subjective:    Patient ID: Kenneth Sheppard, male    DOB: 1960/04/01, 54 y.o.   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  Pt says these happened with either activity, or missing a meal.  He takes 35 units bid Past Medical History  Diagnosis Date  . CORONARY ARTERY DISEASE 08/07/2006  . GERD 08/07/2006  . HYPOTHYROIDISM 08/07/2006  . Diabetes mellitus     Type 2  . Dyslipidemia   . Necrotizing respiratory granulomatosis     Necrotizing granuloma    Past Surgical History  Procedure Laterality Date  . Coronary artery bypass graft      History   Social History  . Marital Status: Married    Spouse Name: N/A  . Number of Children: 2  . Years of Education: N/A   Occupational History  .      Disabled   Social History Main Topics  . Smoking status: Current Every Day Smoker -- 1.00 packs/day    Types: Cigarettes  . Smokeless tobacco: Not on file  . Alcohol Use: Not on file  . Drug Use: Not on file  . Sexual Activity: Not on file   Other Topics Concern  . Not on file   Social History Narrative   Divorced several times    Current Outpatient Prescriptions on File Prior to Visit  Medication Sig Dispense Refill  . aspirin 81 MG tablet Take 81 mg by mouth daily.      . clopidogrel (PLAVIX) 75 MG tablet Take 75 mg by mouth daily.     . Insulin Detemir (LEVEMIR) 100 UNIT/ML Pen Inject 38 Units into the skin 2 (two) times daily. and pen needles 2/day 75 mL 3  . Insulin Pen Needle (B-D UF III MINI PEN NEEDLES) 31G X 5 MM MISC 1 each by Other route 5 (five) times daily. Use as directed 5 times daily 500  each 1  . Insulin Pen Needle (B-D UF III MINI PEN NEEDLES) 31G X 5 MM MISC Use as directed 3 times daily. Dx code: 250.63 300 each 3  . ipratropium-albuterol (DUONEB) 0.5-2.5 (3) MG/3ML SOLN     . Lancet Devices (ACCU-CHEK SOFTCLIX) lancets Use 4 times per day to check blood sugar. 360 each 1  . lisinopril-hydrochlorothiazide (PRINZIDE,ZESTORETIC) 10-12.5 MG per tablet Take 1 tablet by mouth daily.      Marland Kitchen NOVOLOG FLEXPEN 100 UNIT/ML FlexPen Inject 2 units of insulin  for blood sugars over 400.  Maximum dose of 10 units  daily 15 mL 2  . omeprazole (PRILOSEC) 40 MG capsule Take 40 mg by mouth daily. Take 1 tab daily    . ONE TOUCH ULTRA TEST test strip Test blood sugar 4 times  daily 400 each 2  . ONETOUCH DELICA LANCETS FINE MISC Test blood sugar 4 times  daily 400 each 2  . simvastatin (ZOCOR) 80 MG tablet Take 80 mg by mouth at bedtime.      . Vitamin D, Ergocalciferol, (DRISDOL) 50000 UNITS CAPS capsule Take 50,000 Units by mouth every 7 (seven) days.    . sildenafil (VIAGRA) 100 MG tablet Take 1 tablet (100 mg  total) by mouth as needed for erectile dysfunction. 20 tablet 1  . [DISCONTINUED] cetirizine (ZYRTEC) 10 MG tablet Take 10 mg by mouth daily.      . [DISCONTINUED] gabapentin (NEURONTIN) 400 MG capsule Take 400 mg by mouth 3 (three) times daily.       No current facility-administered medications on file prior to visit.    Allergies  Allergen Reactions  . Lyrica [Pregabalin] Anaphylaxis  . Codeine     Family History  Problem Relation Age of Onset  . Diabetes Father     BP 124/70 mmHg  Pulse 67  Temp(Src) 97.5 F (36.4 C) (Oral)  Wt 130 lb (58.968 kg)  SpO2 96%    Review of Systems Denies weight change.      Objective:   Physical Exam VITAL SIGNS:  See vs page GENERAL: no distress Pulses: dorsalis pedis intact bilat.  Feet: no deformity. no edema.  Skin: no ulcer on the feet. feet are of normal color and temp. Dry skin is noted. There is a healed ulcer at the  left leg. at the left ant tibial area is granuloma annuale. There is an old healed burn at the right ankle.  Neuro: sensation is intact to touch on the feet, but severely decreased from normal.  Lab Results  Component Value Date   HGBA1C 9.9* 06/09/2014       Assessment & Plan:  DM: ongoing poor glycemic control Severe and ongoing noncompliance with cbg recording and insulin dosing: I'll work around this as best I can, which is a simple insulin regimen, and achievable goals.    Patient is advised the following: Patient Instructions  Please come back for a follow-up appointment in 3 months.   Please increase the levemir to 38 units twice a day (20 if you are going to be active). If you are unable to anticipate the activity, eat a light snack with it. blood tests are requested for you today.  We'll let you know about the results. check your blood sugar 4 times a day--before the 3 meals, and at bedtime.  also check if you have symptoms of your blood sugar being too high or too low.  please keep a record of the readings and bring it to your next appointment here.  please call us sooner if your blood sugar goes below 70, or if it stays over 200.  On this type of insulin schedule, you should eat meals on a regular schedule.  If a meal is missed or significantly delayed, your blood sugar could go low.

## 2014-06-09 NOTE — Patient Instructions (Addendum)
Please come back for a follow-up appointment in 3 months.   Please increase the levemir to 38 units twice a day (20 if you are going to be active). If you are unable to anticipate the activity, eat a light snack with it. blood tests are requested for you today.  We'll let you know about the results. check your blood sugar 4 times a day--before the 3 meals, and at bedtime.  also check if you have symptoms of your blood sugar being too high or too low.  please keep a record of the readings and bring it to your next appointment here.  please call us sooner if your blood sugar goes below 70, or if it stays over 200.  On this type of insulin schedule, you should eat meals on a regular schedule.  If a meal is missed or significantly delayed, your blood sugar could go low.

## 2014-09-10 ENCOUNTER — Ambulatory Visit: Payer: PRIVATE HEALTH INSURANCE | Admitting: Endocrinology

## 2014-11-15 DIAGNOSIS — I2510 Atherosclerotic heart disease of native coronary artery without angina pectoris: Secondary | ICD-10-CM | POA: Insufficient documentation

## 2014-11-16 ENCOUNTER — Other Ambulatory Visit: Payer: Self-pay | Admitting: Endocrinology

## 2014-12-09 DIAGNOSIS — E7800 Pure hypercholesterolemia, unspecified: Secondary | ICD-10-CM | POA: Insufficient documentation

## 2014-12-17 DIAGNOSIS — Z951 Presence of aortocoronary bypass graft: Secondary | ICD-10-CM | POA: Insufficient documentation

## 2015-01-04 ENCOUNTER — Other Ambulatory Visit: Payer: Self-pay | Admitting: Endocrinology

## 2015-02-08 ENCOUNTER — Other Ambulatory Visit: Payer: Self-pay | Admitting: Endocrinology

## 2015-03-29 ENCOUNTER — Other Ambulatory Visit: Payer: Self-pay | Admitting: Endocrinology

## 2015-06-28 ENCOUNTER — Other Ambulatory Visit: Payer: Self-pay | Admitting: Endocrinology

## 2015-06-29 ENCOUNTER — Other Ambulatory Visit: Payer: Self-pay

## 2015-06-29 MED ORDER — GLUCOSE BLOOD VI STRP: ORAL_STRIP | Status: DC

## 2015-10-16 DIAGNOSIS — B182 Chronic viral hepatitis C: Secondary | ICD-10-CM | POA: Insufficient documentation

## 2015-11-24 ENCOUNTER — Other Ambulatory Visit (HOSPITAL_COMMUNITY): Payer: Self-pay | Admitting: Gastroenterology

## 2015-11-24 DIAGNOSIS — B182 Chronic viral hepatitis C: Secondary | ICD-10-CM

## 2015-12-13 ENCOUNTER — Ambulatory Visit (HOSPITAL_COMMUNITY)
Admission: RE | Admit: 2015-12-13 | Discharge: 2015-12-13 | Disposition: A | Discharge status: Home / Self Care | Payer: Medicare Other | Source: Ambulatory Visit | Attending: Gastroenterology | Admitting: Gastroenterology

## 2015-12-13 DIAGNOSIS — B182 Chronic viral hepatitis C: Secondary | ICD-10-CM | POA: Diagnosis present

## 2015-12-13 DIAGNOSIS — K8020 Calculus of gallbladder without cholecystitis without obstruction: Secondary | ICD-10-CM | POA: Diagnosis not present

## 2016-03-16 DIAGNOSIS — E871 Hypo-osmolality and hyponatremia: Secondary | ICD-10-CM | POA: Insufficient documentation

## 2016-03-16 DIAGNOSIS — R079 Chest pain, unspecified: Secondary | ICD-10-CM | POA: Insufficient documentation

## 2016-03-16 DIAGNOSIS — I639 Cerebral infarction, unspecified: Secondary | ICD-10-CM | POA: Insufficient documentation

## 2016-03-16 DIAGNOSIS — I10 Essential (primary) hypertension: Secondary | ICD-10-CM | POA: Insufficient documentation

## 2016-03-28 DIAGNOSIS — I6523 Occlusion and stenosis of bilateral carotid arteries: Secondary | ICD-10-CM | POA: Insufficient documentation

## 2016-04-16 ENCOUNTER — Other Ambulatory Visit: Payer: Self-pay | Admitting: Endocrinology

## 2016-04-16 NOTE — Telephone Encounter (Signed)
Please refill x 1 Ov is due  

## 2016-06-15 DIAGNOSIS — F329 Major depressive disorder, single episode, unspecified: Secondary | ICD-10-CM | POA: Insufficient documentation

## 2016-06-15 DIAGNOSIS — I6521 Occlusion and stenosis of right carotid artery: Secondary | ICD-10-CM | POA: Insufficient documentation

## 2016-06-15 DIAGNOSIS — I6522 Occlusion and stenosis of left carotid artery: Secondary | ICD-10-CM | POA: Insufficient documentation

## 2016-06-15 DIAGNOSIS — F32A Depression, unspecified: Secondary | ICD-10-CM | POA: Insufficient documentation

## 2016-07-30 DIAGNOSIS — I428 Other cardiomyopathies: Secondary | ICD-10-CM

## 2016-07-30 DIAGNOSIS — R531 Weakness: Secondary | ICD-10-CM

## 2016-07-30 DIAGNOSIS — E785 Hyperlipidemia, unspecified: Secondary | ICD-10-CM | POA: Diagnosis not present

## 2016-07-30 DIAGNOSIS — R7989 Other specified abnormal findings of blood chemistry: Secondary | ICD-10-CM

## 2016-07-30 DIAGNOSIS — I2510 Atherosclerotic heart disease of native coronary artery without angina pectoris: Secondary | ICD-10-CM

## 2016-07-30 DIAGNOSIS — G8929 Other chronic pain: Secondary | ICD-10-CM | POA: Diagnosis not present

## 2016-07-30 DIAGNOSIS — G629 Polyneuropathy, unspecified: Secondary | ICD-10-CM

## 2016-07-30 DIAGNOSIS — E039 Hypothyroidism, unspecified: Secondary | ICD-10-CM

## 2016-07-31 DIAGNOSIS — I2510 Atherosclerotic heart disease of native coronary artery without angina pectoris: Secondary | ICD-10-CM | POA: Diagnosis not present

## 2016-07-31 DIAGNOSIS — R739 Hyperglycemia, unspecified: Secondary | ICD-10-CM | POA: Diagnosis not present

## 2016-07-31 DIAGNOSIS — R7989 Other specified abnormal findings of blood chemistry: Secondary | ICD-10-CM | POA: Diagnosis not present

## 2016-07-31 DIAGNOSIS — G8929 Other chronic pain: Secondary | ICD-10-CM | POA: Diagnosis not present

## 2016-07-31 DIAGNOSIS — R531 Weakness: Secondary | ICD-10-CM | POA: Diagnosis not present

## 2016-07-31 DIAGNOSIS — E039 Hypothyroidism, unspecified: Secondary | ICD-10-CM | POA: Diagnosis not present

## 2016-07-31 DIAGNOSIS — I428 Other cardiomyopathies: Secondary | ICD-10-CM | POA: Diagnosis not present

## 2016-07-31 DIAGNOSIS — I25110 Atherosclerotic heart disease of native coronary artery with unstable angina pectoris: Secondary | ICD-10-CM | POA: Diagnosis not present

## 2016-07-31 DIAGNOSIS — G629 Polyneuropathy, unspecified: Secondary | ICD-10-CM | POA: Diagnosis not present

## 2016-08-13 ENCOUNTER — Ambulatory Visit: Payer: Medicare Other | Admitting: Cardiology

## 2016-09-04 ENCOUNTER — Other Ambulatory Visit: Payer: Self-pay | Admitting: Endocrinology

## 2016-09-08 DEATH — deceased

## 2017-01-01 IMAGING — US US ABDOMEN COMPLETE W/ ELASTOGRAPHY
1 series · 13 of 14 positions shown · non-contrast
Comparison: None.

CLINICAL DATA: Recently diagnosed hepatitis-C.



[Series 1: us abdomen complete w/ elastography · 0.17mm/px · 13 of 14 slices shown]
[im 1/14]
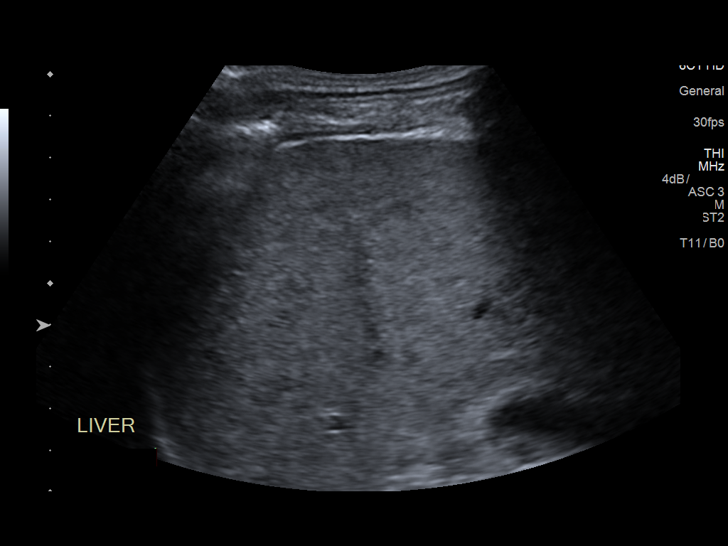
[im 2/14]
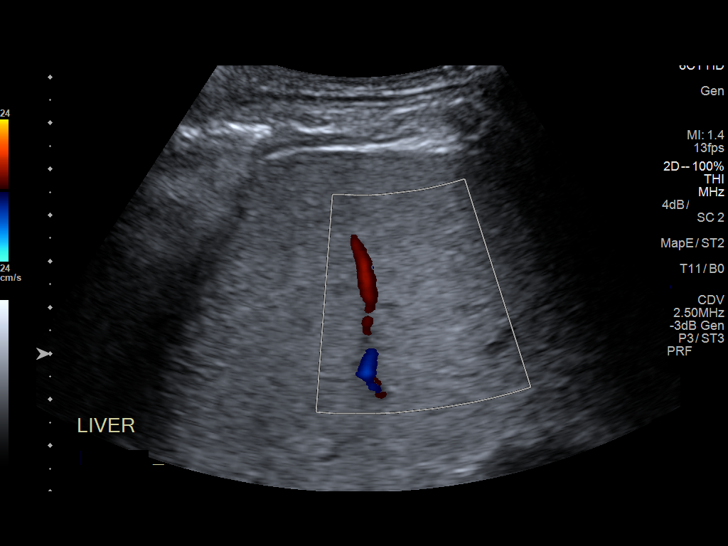
[im 3/14]
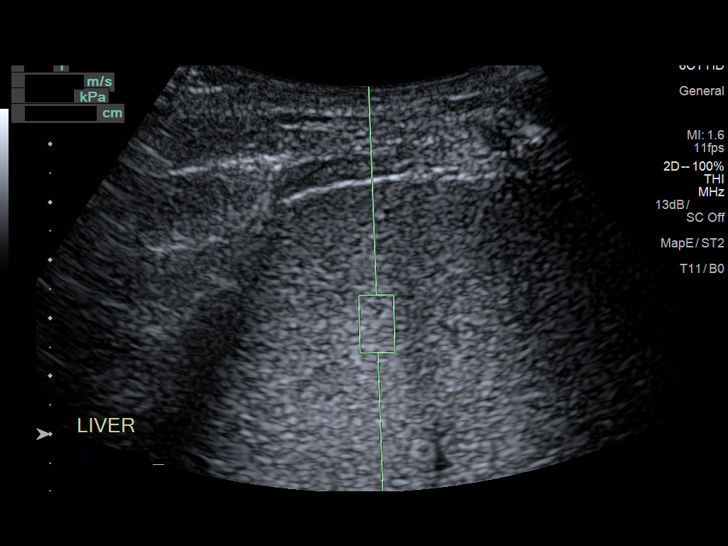
[im 4/14]
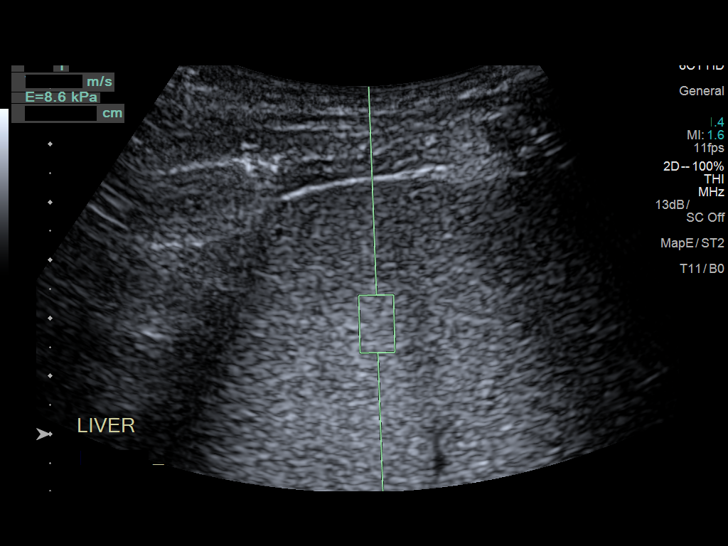
[im 5/14]
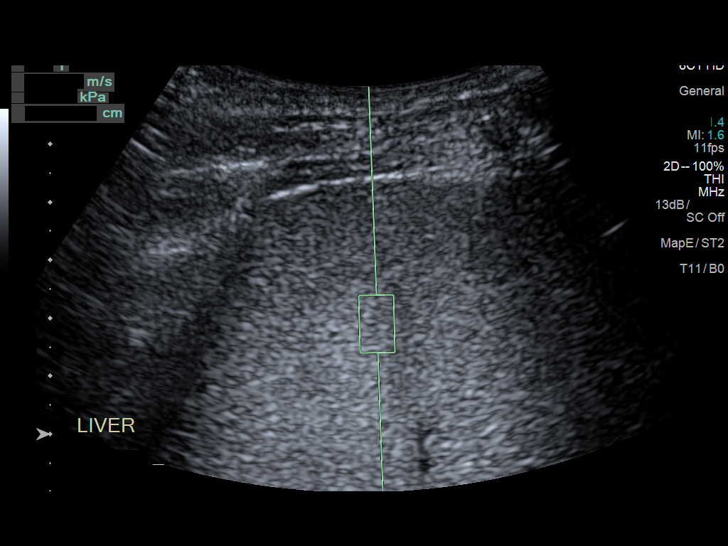
[im 6/14]
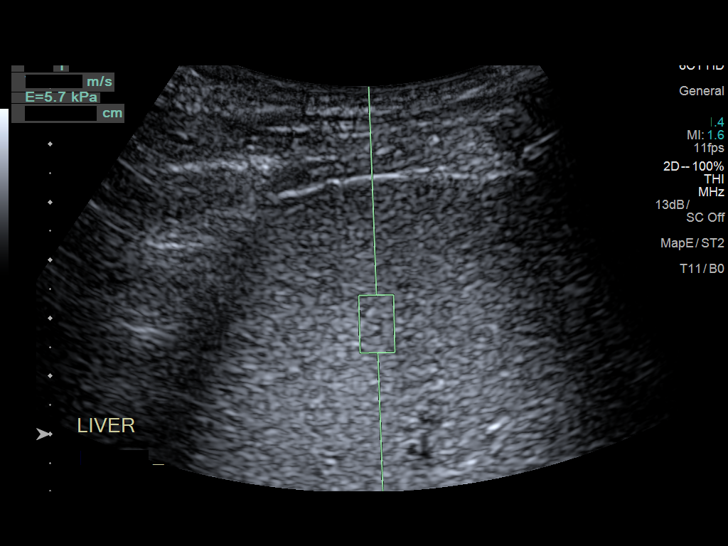
[im 8/14]
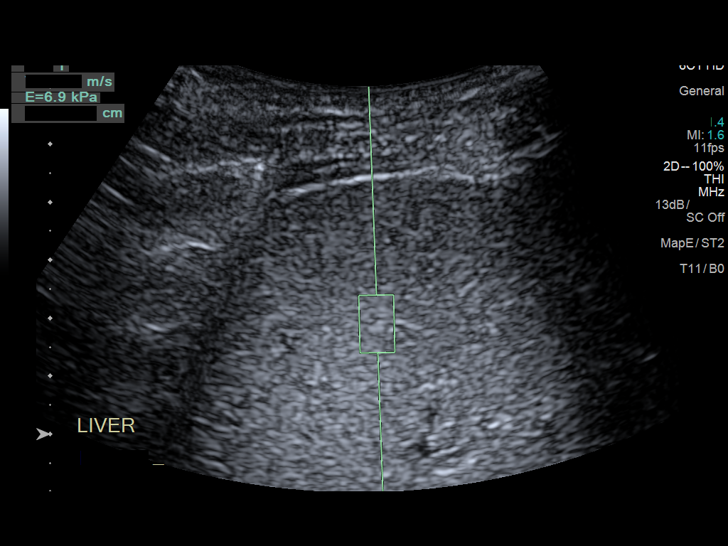
[im 9/14]
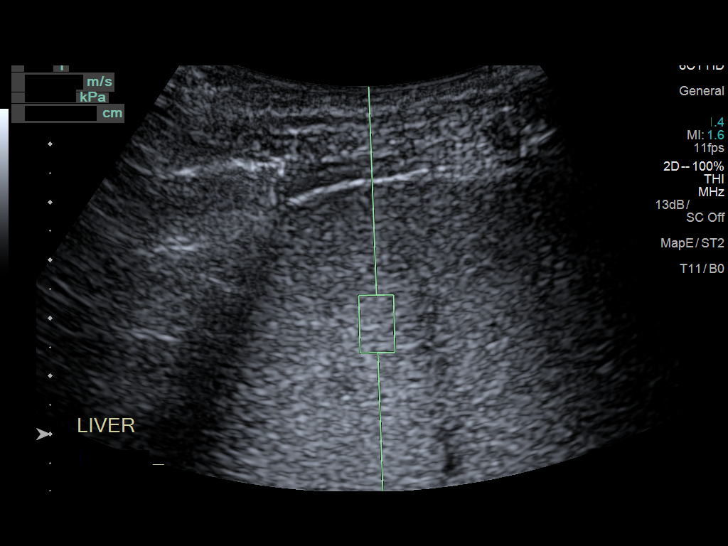
[im 10/14]
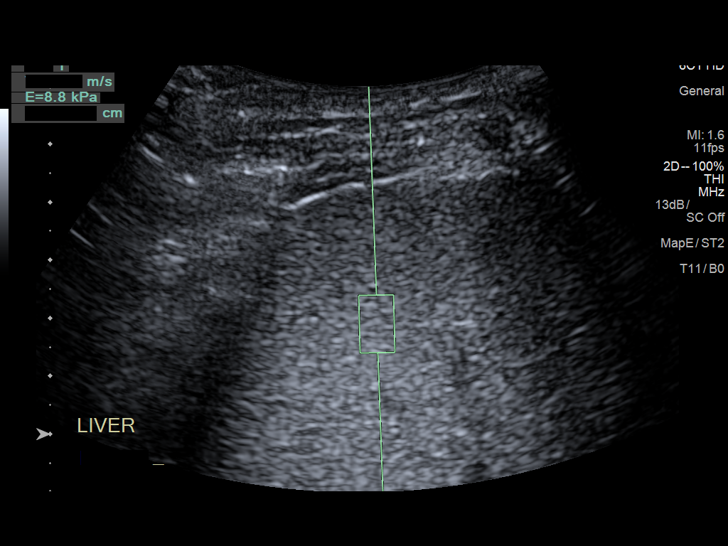
[im 11/14]
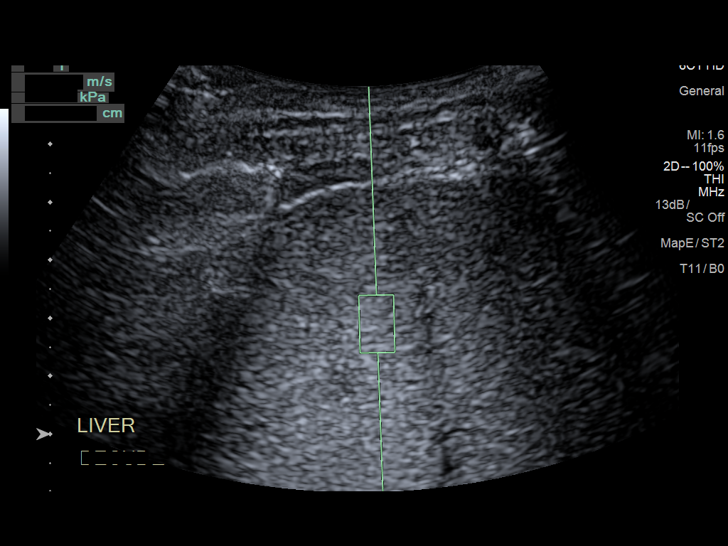
[im 12/14]
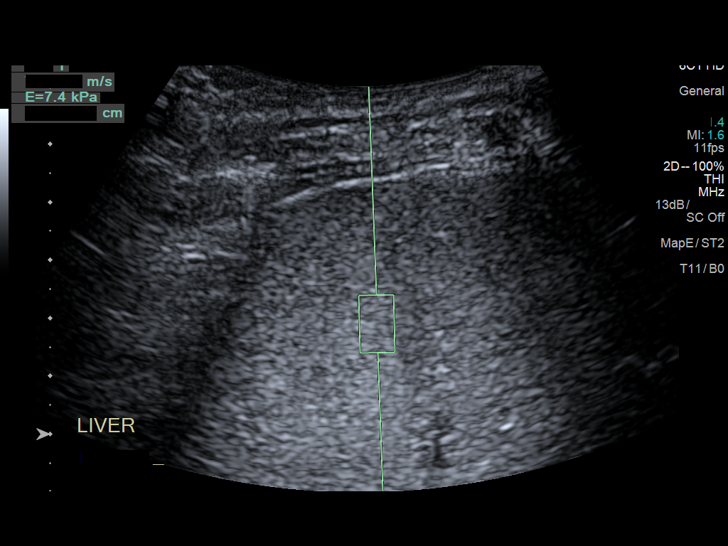
[im 13/14]
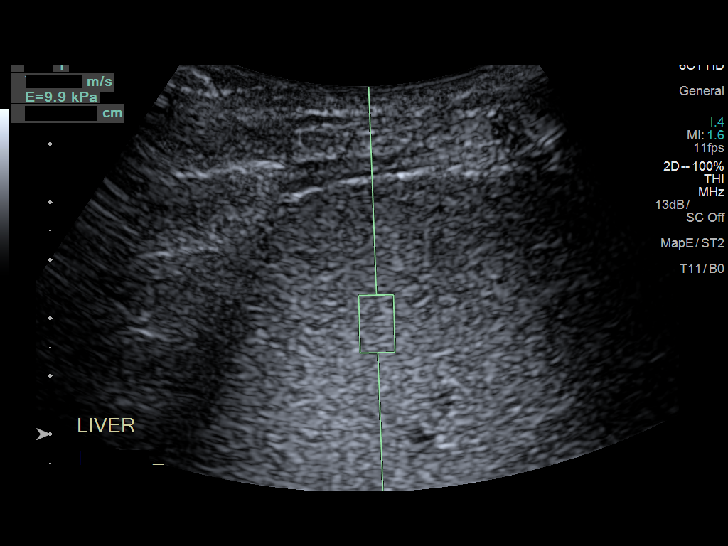
[im 14/14]
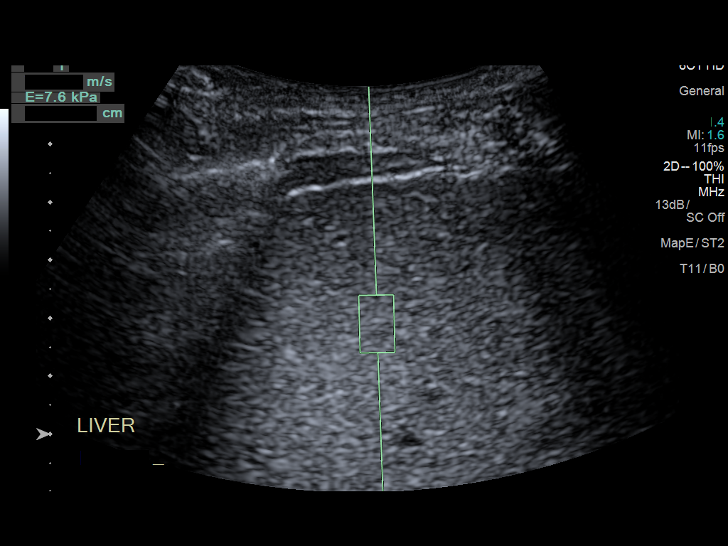

[13 of 14 positions shown; findings below may reference images not displayed]

FINDINGS: ULTRASOUND ABDOMEN

Gallbladder: Numerous tiny gallstones are seen, largest measuring
approximately 3 mm. No evidence of gallbladder wall thickening or
pericholecystic fluid. No sonographic Murphy sign noted by
sonographer.

Common bile duct: Diameter: 4 mm, within normal limits.

Liver: No focal lesion identified. Within normal limits in
parenchymal echogenicity.

IVC: No abnormality visualized.

Pancreas: Visualized portion unremarkable.

Spleen: Size and appearance within normal limits.

Right Kidney: Length: 11.5 cm. Echogenicity within normal limits. No
mass or hydronephrosis visualized.

Left Kidney: Length: 10.6 cm. Echogenicity within normal limits. No
mass or hydronephrosis visualized.

Abdominal aorta: No aneurysm visualized.

Other findings: None.

ULTRASOUND HEPATIC ELASTOGRAPHY

Device: Siemens Helix VTQ

Patient position: Left Lateral Decubitus

Transducer [id number]

Number of measurements: 10

Hepatic segment:  8

Median velocity:   1.64  m/sec

IQR:

IQR/Median velocity ratio:

Corresponding Metavir fibrosis score:  F2 + some F3

Risk of fibrosis: Moderate

Limitations of exam: None

Pertinent findings noted on other imaging exams:  None

Please note that abnormal shear wave velocities may also be
identified in clinical settings other than with hepatic fibrosis,
such as: acute hepatitis, elevated right heart and central venous
pressures including use of beta blockers, Arnaud Joel disease
(Tiger), infiltrative processes such as
mastocytosis/amyloidosis/infiltrative tumor, extrahepatic
cholestasis, in the post-prandial state, and liver transplantation.
Correlation with patient history, laboratory data, and clinical
condition recommended.
IMPRESSION: ULTRASOUND ABDOMEN:
Cholelithiasis. No sonographic evidence of cholecystitis or biliary
ductal dilatation.

Unremarkable sonographic appearance of liver. No hepatic mass
visualized.

ULTRASOUND HEPATIC ELASTOGRAPHY:

Median hepatic shear wave velocity is calculated at 1.64 m/sec.

Corresponding Metavir fibrosis score is  F2 + some F3.

Risk of fibrosis is Moderate.

Follow-up: Additional testing appropriate
